# Patient Record
Sex: Female | Born: 1985 | Race: White | Hispanic: No | Marital: Married | Smoking: Never smoker
Health system: Southern US, Community
[De-identification: ages and names within clinical notes are randomized; demographics above are authoritative.]

## PROBLEM LIST (undated history)

## (undated) ENCOUNTER — Inpatient Hospital Stay (HOSPITAL_COMMUNITY): Payer: Self-pay

## (undated) DIAGNOSIS — F32A Depression, unspecified: Secondary | ICD-10-CM

## (undated) DIAGNOSIS — R87629 Unspecified abnormal cytological findings in specimens from vagina: Secondary | ICD-10-CM

## (undated) DIAGNOSIS — F329 Major depressive disorder, single episode, unspecified: Secondary | ICD-10-CM

## (undated) DIAGNOSIS — F419 Anxiety disorder, unspecified: Secondary | ICD-10-CM

## (undated) DIAGNOSIS — IMO0002 Reserved for concepts with insufficient information to code with codable children: Secondary | ICD-10-CM

## (undated) DIAGNOSIS — Z87440 Personal history of urinary (tract) infections: Secondary | ICD-10-CM

## (undated) HISTORY — DX: Anxiety disorder, unspecified: F41.9

## (undated) HISTORY — DX: Personal history of urinary (tract) infections: Z87.440

## (undated) HISTORY — DX: Major depressive disorder, single episode, unspecified: F32.9

## (undated) HISTORY — DX: Unspecified abnormal cytological findings in specimens from vagina: R87.629

## (undated) HISTORY — DX: Depression, unspecified: F32.A

## (undated) HISTORY — PX: LEEP: SHX91

---
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
## 2010-03-17 ENCOUNTER — Ambulatory Visit (HOSPITAL_COMMUNITY): Admission: RE | Admit: 2010-03-17 | Discharge: 2010-03-17 | Payer: Self-pay | Admitting: Obstetrics and Gynecology

## 2010-04-07 ENCOUNTER — Ambulatory Visit (HOSPITAL_COMMUNITY): Admission: RE | Admit: 2010-04-07 | Discharge: 2010-04-07 | Payer: Self-pay | Admitting: Obstetrics and Gynecology

## 2010-07-15 ENCOUNTER — Inpatient Hospital Stay (HOSPITAL_COMMUNITY): Admission: AD | Admit: 2010-07-15 | Discharge: 2010-07-18 | Payer: Self-pay | Admitting: Obstetrics and Gynecology

## 2010-09-26 ENCOUNTER — Encounter: Payer: Self-pay | Admitting: Obstetrics and Gynecology

## 2010-11-16 LAB — CBC
HCT: 24.3 % — ABNORMAL LOW (ref 36.0–46.0)
Hemoglobin: 12.4 g/dL (ref 12.0–15.0)
MCHC: 33.8 g/dL (ref 30.0–36.0)
MCHC: 34.3 g/dL (ref 30.0–36.0)
Platelets: 148 10*3/uL — ABNORMAL LOW (ref 150–400)
RDW: 12.7 % (ref 11.5–15.5)
WBC: 8.5 10*3/uL (ref 4.0–10.5)
WBC: 9.7 10*3/uL (ref 4.0–10.5)

## 2011-07-10 IMAGING — US US OB FOLLOW-UP
1 series · 18 of 28 positions shown · non-contrast
Comparison: none

OBSTETRICAL ULTRASOUND:
 This ultrasound was performed in The [HOSPITAL], and the [facility] report will be stored to [REDACTED] PACS.  This report is also available in [HOSPITAL]?s accessANYware.

[Series 1: us ob follow-up · 18 of 46 slices shown]
[im 1/46]
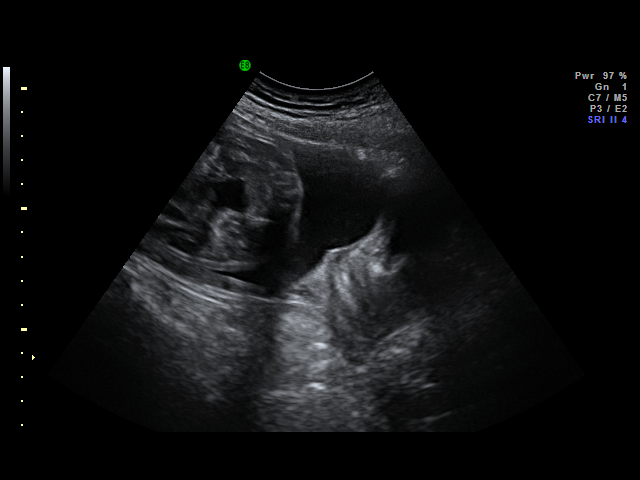
[im 4/46]
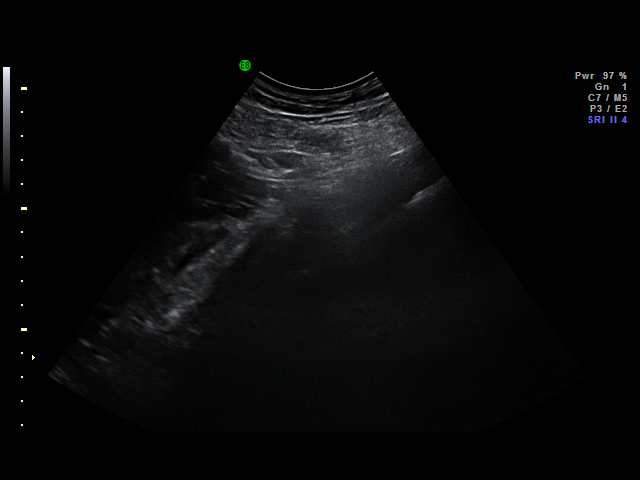
[im 6/46]
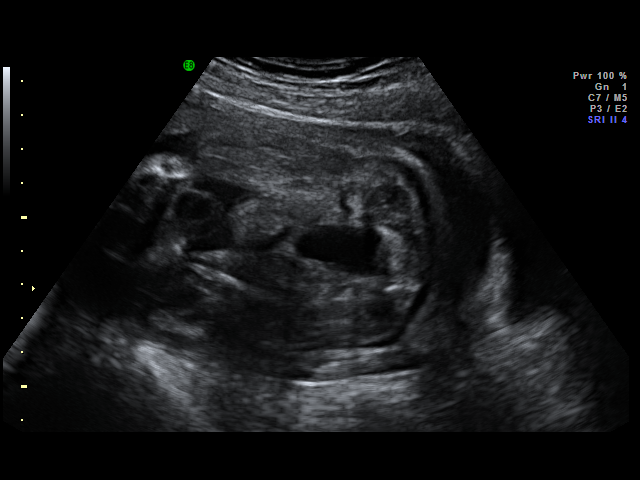
[im 9/46]
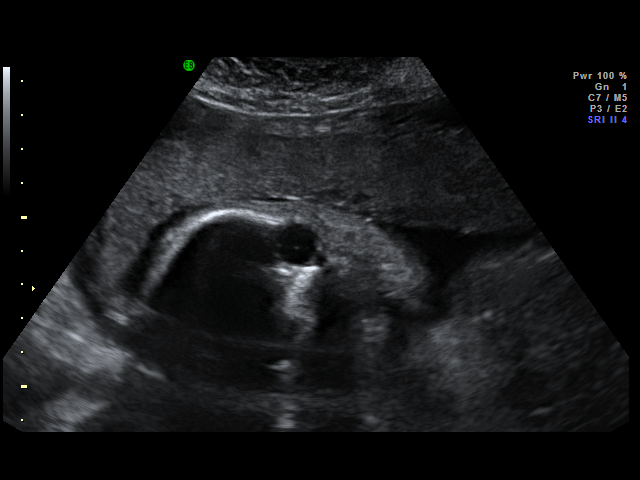
[im 12/46]
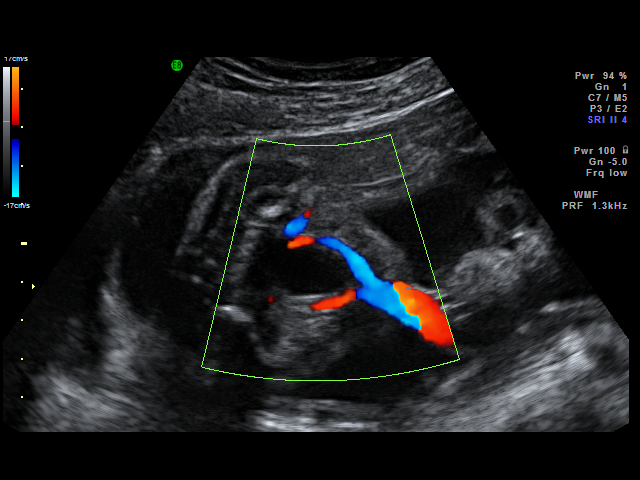
[im 14/46]
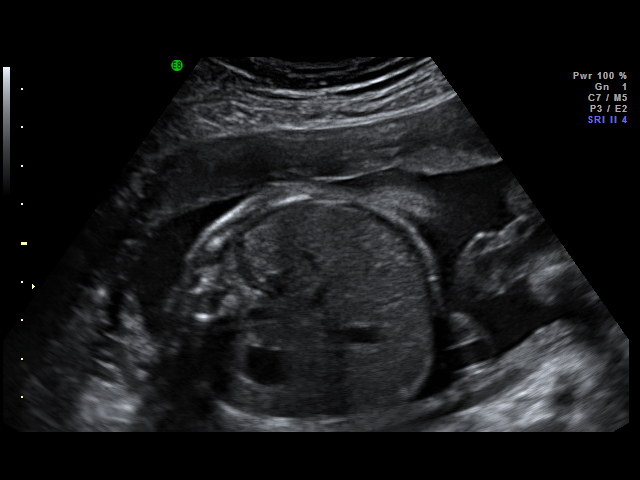
[im 17/46]
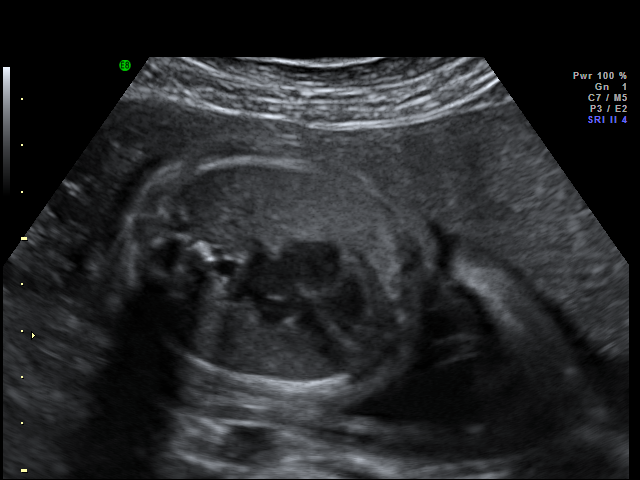
[im 19/46]
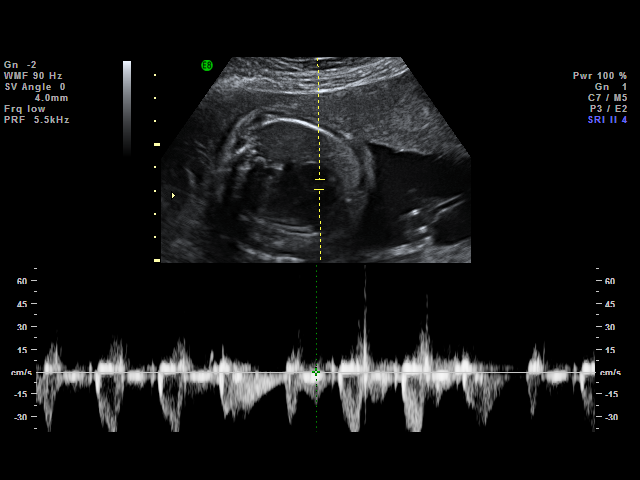
[im 22/46]
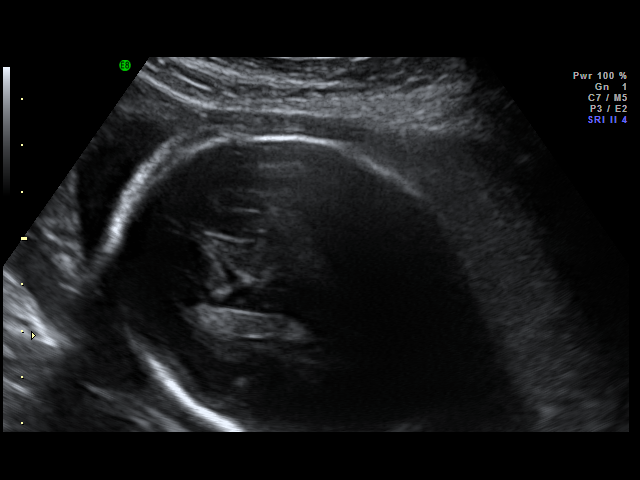
[im 24/46]
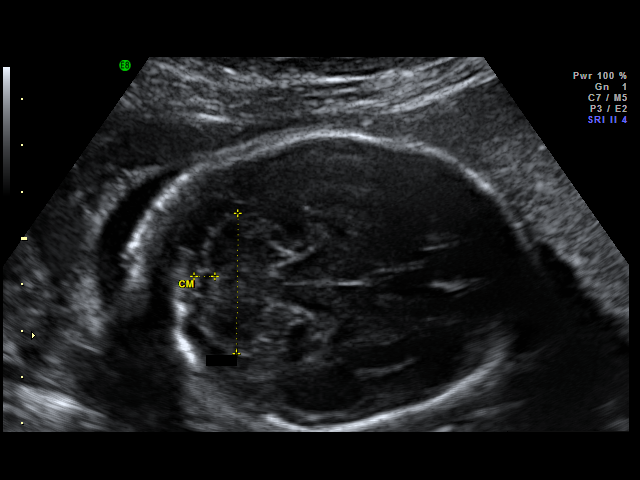
[im 27/46]
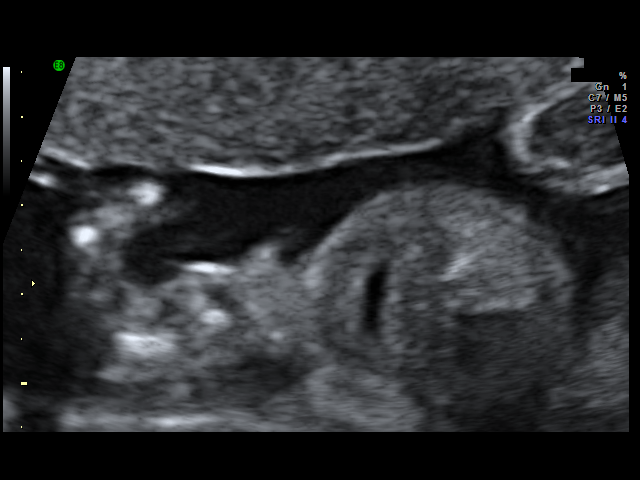
[im 29/46]
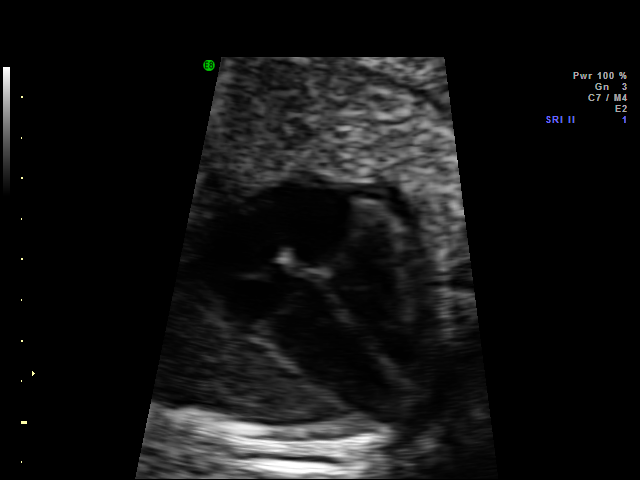
[im 32/46]
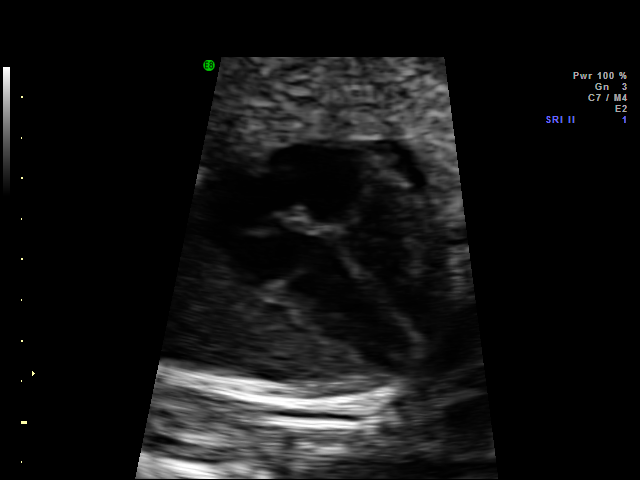
[im 36/46]
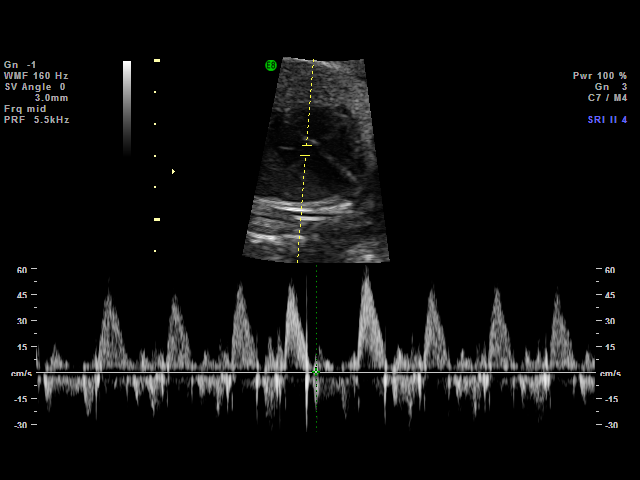
[im 37/46]
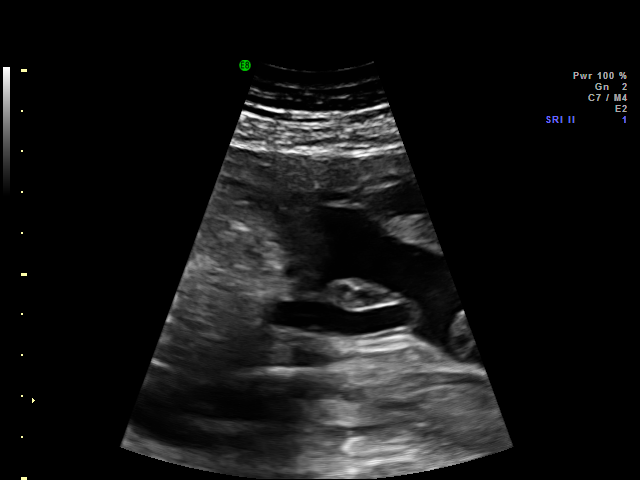
[im 41/46]
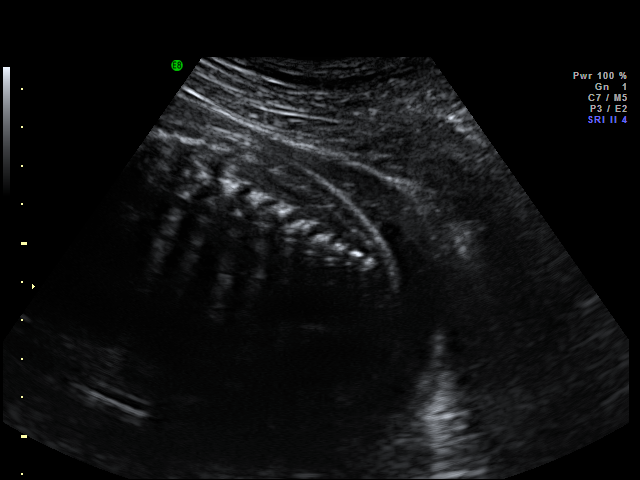
[im 42/46]
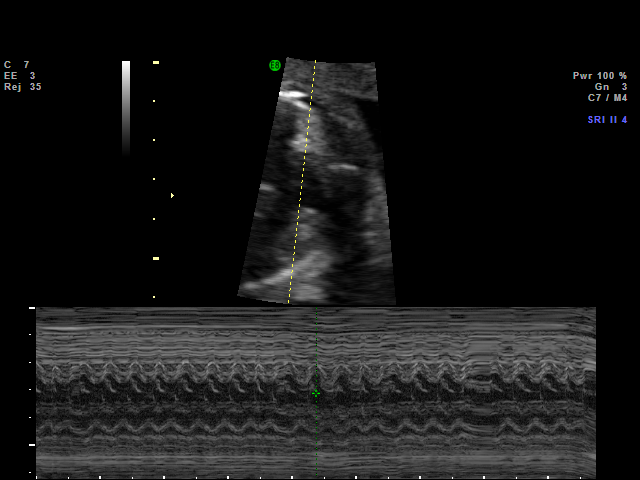
[im 46/46]
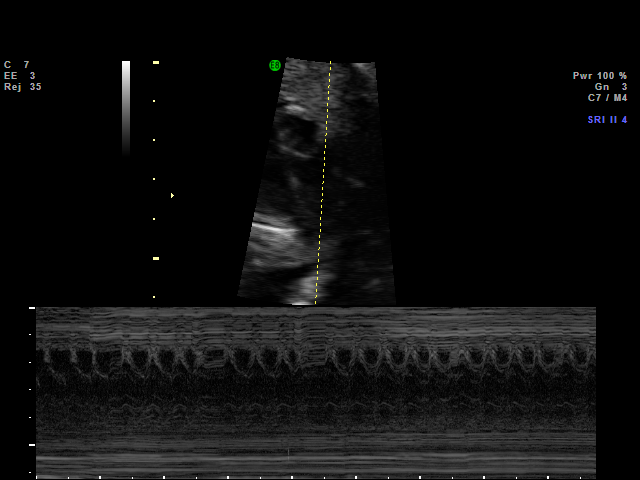

[18 of 28 positions shown; findings below may reference images not displayed]

IMPRESSION: [facility] has also been faxed to the ordering physician.

## 2012-01-19 NOTE — H&P (Addendum)
26 yo G2P1 w/ missed Ab presents for surgical mngt  PMHx: neg PSHx:  none Meds: PNV All:  None OBhx:  SVD x 1 FHx:  N/c  AF, VSS gen - NAD Abd - soft, NT Ext - no edema CV - RRR Lungs - clear  [place] - IUP at 8wks, no CM  A/P:  Missed Ab.  Exp mngt vs medical mngt vs surgical mngt.  Pt elects for D&E Plan of care discussed again with pt.  Informed consent

## 2012-01-22 MED ORDER — DEXTROSE 5 % IV SOLN
1.0000 g | INTRAVENOUS | Status: AC
  Administered 2012-01-23: 1 g via INTRAVENOUS
  Filled 2012-01-22: qty 1

## 2012-01-23 ENCOUNTER — Encounter (HOSPITAL_COMMUNITY)
Admission: RE | Disposition: A | Discharge status: Home / Self Care | Payer: Self-pay | Source: Ambulatory Visit | Attending: Obstetrics and Gynecology

## 2012-01-23 ENCOUNTER — Encounter (HOSPITAL_COMMUNITY): Payer: Self-pay | Admitting: Anesthesiology

## 2012-01-23 ENCOUNTER — Encounter (HOSPITAL_COMMUNITY): Payer: Self-pay | Admitting: *Deleted

## 2012-01-23 ENCOUNTER — Ambulatory Visit (HOSPITAL_COMMUNITY)
Admission: RE | Admit: 2012-01-23 | Discharge: 2012-01-23 | Disposition: A | Discharge status: Home / Self Care | Payer: BC Managed Care – PPO | Source: Ambulatory Visit | Attending: Obstetrics and Gynecology | Admitting: Obstetrics and Gynecology

## 2012-01-23 ENCOUNTER — Ambulatory Visit (HOSPITAL_COMMUNITY): Payer: BC Managed Care – PPO | Admitting: Anesthesiology

## 2012-01-23 DIAGNOSIS — O021 Missed abortion: Secondary | ICD-10-CM | POA: Insufficient documentation

## 2012-01-23 HISTORY — PX: DILATION AND EVACUATION: SHX1459

## 2012-01-23 LAB — CBC
HCT: 34.7 % — ABNORMAL LOW (ref 36.0–46.0)
MCH: 30.4 pg (ref 26.0–34.0)
MCV: 88.7 fL (ref 78.0–100.0)
Platelets: 181 10*3/uL (ref 150–400)
RBC: 3.91 MIL/uL (ref 3.87–5.11)
RDW: 12.1 % (ref 11.5–15.5)
WBC: 4.3 10*3/uL (ref 4.0–10.5)

## 2012-01-23 SURGERY — DILATION AND EVACUATION, UTERUS
Anesthesia: Monitor Anesthesia Care

## 2012-01-23 MED ORDER — MIDAZOLAM HCL 5 MG/5ML IJ SOLN
INTRAMUSCULAR | Status: DC | PRN
  Administered 2012-01-23: 2 mg via INTRAVENOUS

## 2012-01-23 MED ORDER — DEXAMETHASONE SODIUM PHOSPHATE 10 MG/ML IJ SOLN
INTRAMUSCULAR | Status: AC
  Filled 2012-01-23: qty 1

## 2012-01-23 MED ORDER — MIDAZOLAM HCL 2 MG/2ML IJ SOLN
INTRAMUSCULAR | Status: AC
  Filled 2012-01-23: qty 2

## 2012-01-23 MED ORDER — HYDROCODONE-ACETAMINOPHEN 5-500 MG PO TABS: 1.0000 | ORAL_TABLET | Freq: Four times a day (QID) | ORAL | Status: AC | PRN

## 2012-01-23 MED ORDER — DEXAMETHASONE SODIUM PHOSPHATE 4 MG/ML IJ SOLN
INTRAMUSCULAR | Status: DC | PRN
  Administered 2012-01-23: 10 mg via INTRAVENOUS

## 2012-01-23 MED ORDER — KETOROLAC TROMETHAMINE 30 MG/ML IJ SOLN
INTRAMUSCULAR | Status: DC | PRN
  Administered 2012-01-23: 30 mg via INTRAVENOUS

## 2012-01-23 MED ORDER — LIDOCAINE HCL (CARDIAC) 20 MG/ML IV SOLN
INTRAVENOUS | Status: AC
  Filled 2012-01-23: qty 5

## 2012-01-23 MED ORDER — PROPOFOL 10 MG/ML IV EMUL
INTRAVENOUS | Status: DC | PRN
  Administered 2012-01-23: 100 ug/kg/min via INTRAVENOUS

## 2012-01-23 MED ORDER — PROPOFOL 10 MG/ML IV EMUL
INTRAVENOUS | Status: AC
  Filled 2012-01-23: qty 20

## 2012-01-23 MED ORDER — FENTANYL CITRATE 0.05 MG/ML IJ SOLN
INTRAMUSCULAR | Status: DC | PRN
  Administered 2012-01-23 (×2): 50 ug via INTRAVENOUS

## 2012-01-23 MED ORDER — ONDANSETRON HCL 4 MG/2ML IJ SOLN
INTRAMUSCULAR | Status: DC | PRN
  Administered 2012-01-23: 4 mg via INTRAVENOUS

## 2012-01-23 MED ORDER — FENTANYL CITRATE 0.05 MG/ML IJ SOLN: 25.0000 ug | INTRAMUSCULAR | Status: DC | PRN

## 2012-01-23 MED ORDER — LACTATED RINGERS IV SOLN
INTRAVENOUS | Status: DC
  Administered 2012-01-23: 125 mL/h via INTRAVENOUS

## 2012-01-23 MED ORDER — CHLOROPROCAINE HCL 1 % IJ SOLN
INTRAMUSCULAR | Status: AC
  Filled 2012-01-23: qty 30

## 2012-01-23 MED ORDER — IBUPROFEN 200 MG PO TABS: 800.0000 mg | ORAL_TABLET | Freq: Three times a day (TID) | ORAL | Status: AC | PRN

## 2012-01-23 MED ORDER — ONDANSETRON HCL 4 MG/2ML IJ SOLN
INTRAMUSCULAR | Status: AC
  Filled 2012-01-23: qty 2

## 2012-01-23 MED ORDER — METHYLERGONOVINE MALEATE 0.2 MG PO TABS: 0.2000 mg | ORAL_TABLET | Freq: Four times a day (QID) | ORAL | Status: DC

## 2012-01-23 MED ORDER — FENTANYL CITRATE 0.05 MG/ML IJ SOLN
INTRAMUSCULAR | Status: AC
  Filled 2012-01-23: qty 2

## 2012-01-23 MED ORDER — KETOROLAC TROMETHAMINE 30 MG/ML IJ SOLN
INTRAMUSCULAR | Status: AC
  Filled 2012-01-23: qty 1

## 2012-01-23 SURGICAL SUPPLY — 20 items
CATH ROBINSON RED A/P 16FR (CATHETERS) ×2 IMPLANT
CLOTH BEACON ORANGE TIMEOUT ST (SAFETY) ×2 IMPLANT
DECANTER SPIKE VIAL GLASS SM (MISCELLANEOUS) ×2 IMPLANT
GLOVE BIO SURGEON STRL SZ 6.5 (GLOVE) ×2 IMPLANT
GLOVE BIOGEL PI IND STRL 7.0 (GLOVE) ×1 IMPLANT
GLOVE BIOGEL PI INDICATOR 7.0 (GLOVE) ×1
GOWN PREVENTION PLUS LG XLONG (DISPOSABLE) ×2 IMPLANT
GOWN STRL REIN XL XLG (GOWN DISPOSABLE) ×2 IMPLANT
KIT BERKELEY 1ST TRIMESTER 3/8 (MISCELLANEOUS) ×2 IMPLANT
NEEDLE SPNL 22GX3.5 QUINCKE BK (NEEDLE) ×2 IMPLANT
NS IRRIG 1000ML POUR BTL (IV SOLUTION) ×2 IMPLANT
PACK VAGINAL MINOR WOMEN LF (CUSTOM PROCEDURE TRAY) ×2 IMPLANT
PAD PREP 24X48 CUFFED NSTRL (MISCELLANEOUS) ×2 IMPLANT
SET BERKELEY SUCTION TUBING (SUCTIONS) ×2 IMPLANT
SYR CONTROL 10ML LL (SYRINGE) ×2 IMPLANT
TOWEL OR 17X24 6PK STRL BLUE (TOWEL DISPOSABLE) ×4 IMPLANT
VACURETTE 10 RIGID CVD (CANNULA) IMPLANT
VACURETTE 7MM CVD STRL WRAP (CANNULA) IMPLANT
VACURETTE 8 RIGID CVD (CANNULA) IMPLANT
VACURETTE 9 RIGID CVD (CANNULA) IMPLANT

## 2012-01-23 NOTE — Op Note (Signed)
NAMEANALYSE, ANGST NO.:  [id number]  MEDICAL RECORD NO.:  [id number]  LOCATION:  [facility]                          FACILITY:  [facility]  PHYSICIAN:  Zelphia Cairo, MD    DATE OF BIRTH:  Jun 15, 1986  DATE OF PROCEDURE: DATE OF DISCHARGE:  01/23/2012                              OPERATIVE REPORT   PREOPERATIVE DIAGNOSIS:  Missed abortion.  POSTOPERATIVE DIAGNOSIS:  Missed abortion.  PROCEDURES: 1. Cervical block. 2. Dilation and evacuation.  SURGEON:  Zelphia Cairo, MD  ANESTHESIA:  MAC with local.  COMPLICATIONS:  None.  CONDITION:  Stable to recovery room.  PROCEDURE:  The patient was taken to the operating room.  After informed consent was obtained, she was given anesthesia, placed in the dorsal lithotomy position using Allen stirrups.  She was prepped and draped in a sterile fashion and an in-and-out catheter was used to drain her bladder.  Bivalve speculum was placed in the vagina and 1 mL of 1% Nesacaine was used at the anterior lip of the cervix.  Single-tooth tenaculum was placed on the anterior lip of the cervix and a cervical block was performed.  The cervix was then serially dilated using Pratt dilators.  An 8-French suction catheter was inserted into the endometrial cavity and products of conception were removed without difficulty.  A gentle curetting was then performed to ensure uterine cry.  No further products of conception were removed.  Suction curette was inserted 1 last time to remove any clots and debris.  Tenaculum was removed.  The cervix was hemostatic.  Speculum was removed.  The patient was taken to the recovery room in stable condition.     Zelphia Cairo, MD     GA/MEDQ  D:  01/23/2012  T:  01/23/2012  Job:  [id number]

## 2012-01-23 NOTE — Anesthesia Preprocedure Evaluation (Signed)
Anesthesia Evaluation  Patient identified by MRN, date of birth, ID band Patient awake    Reviewed: Allergy & Precautions, H&P , Patient's Chart, lab work & pertinent test results, reviewed documented beta blocker date and time   Airway Mallampati: II TM Distance: >3 FB Neck ROM: full    Dental No notable dental hx.    Pulmonary  breath sounds clear to auscultation  Pulmonary exam normal       Cardiovascular Rhythm:regular Rate:Normal     Neuro/Psych    GI/Hepatic   Endo/Other    Renal/GU      Musculoskeletal   Abdominal   Peds  Hematology   Anesthesia Other Findings   Reproductive/Obstetrics                           Anesthesia Physical Anesthesia Plan  ASA: II  Anesthesia Plan: MAC   Post-op Pain Management:    Induction: Intravenous  Airway Management Planned: Mask, Natural Airway and Simple Face Mask  Additional Equipment:   Intra-op Plan:   Post-operative Plan:   Informed Consent: I have reviewed the patients History and Physical, chart, labs and discussed the procedure including the risks, benefits and alternatives for the proposed anesthesia with the patient or authorized representative who has indicated his/her understanding and acceptance.   Dental Advisory Given  Plan Discussed with: CRNA and Surgeon  Anesthesia Plan Comments: (  Discussed  MAC and general anesthesia, including possible nausea, instrumentation of airway, sore throat,pulmonary aspiration, etc. I asked if the were any outstanding questions, or  concerns before we proceeded. )        Anesthesia Quick Evaluation

## 2012-01-23 NOTE — Anesthesia Postprocedure Evaluation (Signed)
Anesthesia Post Note  Patient: Courtney Tyler  Procedure(s) Performed: Procedure(s) (LRB): DILATATION AND EVACUATION (N/A)  Anesthesia type: MAC  Patient location: PACU  Post pain: Pain level controlled  Post assessment: Post-op Vital signs reviewed  Last Vitals:  Filed Vitals:   01/23/12 0800  BP: 91/49  Pulse: 51  Temp:   Resp: 16    Post vital signs: Reviewed  Level of consciousness: sedated  Complications: No apparent anesthesia complications

## 2012-01-23 NOTE — Transfer of Care (Signed)
Immediate Anesthesia Transfer of Care Note  Patient: Courtney Tyler  Procedure(s) Performed: Procedure(s) (LRB): DILATATION AND EVACUATION (N/A)  Patient Location: PACU  Anesthesia Type: MAC  Level of Consciousness: awake, alert  and oriented  Airway & Oxygen Therapy: Patient Spontanous Breathing  Post-op Assessment: Report given to PACU RN and Post -op Vital signs reviewed and stable  Post vital signs: Reviewed and stable  Complications: No apparent anesthesia complications

## 2012-01-23 NOTE — Discharge Instructions (Signed)
[facility] office 2-3 weeks for postop appointment.  Call the office [phone] for an appointment.  Personal Hygiene: Use pads not tampons x 1week You may shower, no tub baths or pools for 2-3 weeks Wipe from front to back when using restroom  Activity: Do not drive or operate any equipment for 24 hrs.   Do not rest in bed all day Walking is encouraged Walk up and down stairs slowly You may return to your normal activity in 1-2 days  Sexual Activity:  No intercourse for 2 weeks after the procedure.  Diet: Eat a light meal as desired this evening.  You may resume your usual diet tomorrow.  Return to work:  You may resume your work activities after 1-2 days  What to expect:  Expect to have vaginal bleeding/discharge for 2-3 days and spotting for 10-14 days.  It is not unusual to have soreness for 1-2 weeks.  You may have a slight burning sensation when you urinate for the first few days.  You may start your menses in 2-6 weeks.  Mild cramps may continue for a couple of days.    Call your doctor:   Excessive bleeding, saturating a pad every hour Inability to urinate 6 hours after discharge Pain not relieved with pain medications Fever of 100.4 or greater DISCHARGE INSTRUCTIONS: D&C / D&E The following instructions have been prepared to help you care for yourself upon your return home.   Personal hygiene: Marland Kitchen Use sanitary pads for vaginal drainage, not tampons. . Shower the day after your procedure. . NO tub baths, pools or Jacuzzis for 2-3 weeks. . Wipe front to back after using the bathroom.  Activity and limitations: . Do NOT drive or operate any equipment for 24 hours. The effects of anesthesia are still present and drowsiness may result. . Do NOT rest in bed all day. . Walking is encouraged. . Walk up and down stairs slowly. . You may resume your normal activity in one to two days or as indicated by your physician.  Sexual activity: NO intercourse for at least 2 weeks after the  procedure, or as indicated by your physician.  Diet: Eat a light meal as desired this evening. You may resume your usual diet tomorrow.  Return to work: You may resume your work activities in one to two days or as indicated by your doctor.  What to expect after your surgery: Expect to have vaginal bleeding/discharge for 2-3 days and spotting for up to 10 days. It is not unusual to have soreness for up to 1-2 weeks. You may have a slight burning sensation when you urinate for the first day. Mild cramps may continue for a couple of days. You may have a regular period in 2-6 weeks.  Call your doctor for any of the following: . Excessive vaginal bleeding, saturating and changing one pad every hour. . Inability to urinate 6 hours after discharge from hospital. . Pain not relieved by pain medication. . Fever of 100.4 F or greater. . Unusual vaginal discharge or odor.  Return to office ________________ Call for an appointment ___________________  Patient's signature: ______________________  Nurse's signature ________________________  Post Anesthesia Care Unit [phone]

## 2012-03-11 ENCOUNTER — Emergency Department (HOSPITAL_COMMUNITY): Payer: BC Managed Care – PPO

## 2012-03-11 ENCOUNTER — Encounter (HOSPITAL_COMMUNITY): Payer: Self-pay

## 2012-03-11 ENCOUNTER — Emergency Department (HOSPITAL_COMMUNITY)
Admission: EM | Admit: 2012-03-11 | Discharge: 2012-03-11 | Disposition: A | Discharge status: Home / Self Care | Payer: BC Managed Care – PPO | Attending: Emergency Medicine | Admitting: Emergency Medicine

## 2012-03-11 DIAGNOSIS — N939 Abnormal uterine and vaginal bleeding, unspecified: Secondary | ICD-10-CM

## 2012-03-11 DIAGNOSIS — N898 Other specified noninflammatory disorders of vagina: Secondary | ICD-10-CM | POA: Insufficient documentation

## 2012-03-11 DIAGNOSIS — N9489 Other specified conditions associated with female genital organs and menstrual cycle: Secondary | ICD-10-CM | POA: Insufficient documentation

## 2012-03-11 DIAGNOSIS — N858 Other specified noninflammatory disorders of uterus: Secondary | ICD-10-CM

## 2012-03-11 LAB — URINALYSIS, ROUTINE W REFLEX MICROSCOPIC
Glucose, UA: NEGATIVE mg/dL
Leukocytes, UA: NEGATIVE
Specific Gravity, Urine: 1.006 (ref 1.005–1.030)
pH: 7 (ref 5.0–8.0)

## 2012-03-11 LAB — HCG, QUANTITATIVE, PREGNANCY: hCG, Beta Chain, Quant, S: 323 m[IU]/mL — ABNORMAL HIGH (ref <5)

## 2012-03-11 LAB — BASIC METABOLIC PANEL
CO2: 26 mEq/L (ref 19–32)
Chloride: 104 mEq/L (ref 96–112)
Creatinine, Ser: 0.79 mg/dL (ref 0.50–1.10)
GFR calc Af Amer: >90 mL/min (ref >90)
Potassium: 3.8 mEq/L (ref 3.5–5.1)

## 2012-03-11 LAB — CBC WITH DIFFERENTIAL/PLATELET
Basophils Absolute: 0 10*3/uL (ref 0.0–0.1)
Basophils Relative: 0 % (ref 0–1)
HCT: 37.2 % (ref 36.0–46.0)
Hemoglobin: 12.6 g/dL (ref 12.0–15.0)
Lymphocytes Relative: 28 % (ref 12–46)
MCHC: 33.9 g/dL (ref 30.0–36.0)
Monocytes Absolute: 0.2 10*3/uL (ref 0.1–1.0)
Monocytes Relative: 5 % (ref 3–12)
Neutro Abs: 3 10*3/uL (ref 1.7–7.7)
Neutrophils Relative %: 66 % (ref 43–77)
RDW: 12.1 % (ref 11.5–15.5)
WBC: 4.6 10*3/uL (ref 4.0–10.5)

## 2012-03-11 LAB — PREGNANCY, URINE: Preg Test, Ur: POSITIVE — AB

## 2012-03-11 NOTE — ED Provider Notes (Signed)
History     CSN: [id number]  Arrival date & time 03/11/12  0931   First MD Initiated Contact with Patient 03/11/12 [phone]      Chief Complaint  Patient presents with  . Vaginal Bleeding    (Consider location/radiation/quality/duration/timing/severity/associated sxs/prior treatment) HPI Pt had D&C in May for retained products after spontaneous abortion. Has had periodic bleeding since and was much worse today passing blood clots. Pt had lightheadedness earlier though currently asymptomatic.  Past Medical History  Diagnosis Date  . No pertinent past medical history     Past Surgical History  Procedure Date  . Vaginal delivery 07/16/10  . Dilation and evacuation 01/23/2012    Procedure: DILATATION AND EVACUATION;  Surgeon: Zelphia Cairo, MD;  Location: [facility];  Service: Gynecology;  Laterality: N/A;    No family history on file.  History  Substance Use Topics  . Smoking status: Not on file  . Smokeless tobacco: Not on file  . Alcohol Use: No    OB History    Grav Para Term Preterm Abortions TAB SAB Ect Mult Living                  Review of Systems  Constitutional: Negative for fever and chills.  Gastrointestinal: Negative for nausea, vomiting and abdominal pain.  Genitourinary: Positive for vaginal bleeding. Negative for vaginal discharge, vaginal pain and pelvic pain.  Neurological: Positive for light-headedness. Negative for weakness and headaches.    Allergies  Review of patient's allergies indicates no known allergies.  Home Medications   Current Outpatient Rx  Name Route Sig Dispense Refill  . GREEN COFFEE BEAN PO Oral Take 1 tablet by mouth daily.    Marland Kitchen PRENATAL MULTIVITAMIN CH Oral Take 1 tablet by mouth daily.      BP 129/82  Pulse 55  Temp 97.9 F (36.6 C) (Oral)  Resp 16  Ht 5' 7.5" (1.715 m)  Wt 150 lb (68.04 kg)  BMI 23.15 kg/m2  SpO2 100%  LMP 03/11/2012  Physical Exam  Nursing note and vitals reviewed. Constitutional: She is oriented  to person, place, and time. She appears well-developed and well-nourished. No distress.  HENT:  Head: Normocephalic and atraumatic.  Mouth/Throat: Oropharynx is clear and moist.  Eyes: EOM are normal. Pupils are equal, round, and reactive to light.  Neck: Normal range of motion. Neck supple.  Cardiovascular: Normal rate and regular rhythm.   Pulmonary/Chest: Effort normal and breath sounds normal. No respiratory distress. She has no wheezes. She has no rales.  Abdominal: Soft. Bowel sounds are normal. There is no tenderness. There is no rebound and no guarding.  Genitourinary:       Small amount of blood in vaginal vault. Os closed. No clots or tissue present. No vag d/c  Musculoskeletal: Normal range of motion. She exhibits no edema and no tenderness.  Neurological: She is alert and oriented to person, place, and time.       Moves all ext without deficit  Skin: Skin is warm and dry. No rash noted. No erythema.  Psychiatric: She has a normal mood and affect. Her behavior is normal.    ED Course  Procedures (including critical care time)  Labs Reviewed  URINALYSIS, ROUTINE W REFLEX MICROSCOPIC - Abnormal; Notable for the following:    Hgb urine dipstick LARGE (*)     All other components within normal limits  PREGNANCY, URINE - Abnormal; Notable for the following:    Preg Test, Ur POSITIVE (*)  All other components within normal limits  HCG, QUANTITATIVE, PREGNANCY - Abnormal; Notable for the following:    hCG, Beta Chain, Quant, S 323 (*)     All other components within normal limits  CBC WITH DIFFERENTIAL  BASIC METABOLIC PANEL  URINE MICROSCOPIC-ADD ON   US Ob Comp Less 14 Wks  03/11/2012  *RADIOLOGY REPORT*  Clinical Data: Vaginal bleeding.  18 weeks and 1 day pregnant by last menstrual period.  Quantitative beta HCG pending.  OBSTETRIC <14 WK [place] AND TRANSVAGINAL OB US  Technique:  Both transabdominal and transvaginal ultrasound examinations were performed for complete  evaluation of the gestation as well as the maternal uterus, adnexal regions, and pelvic cul-de-sac.  Transvaginal technique was performed to assess early pregnancy.  Comparison:  Previous examinations, the most recent dated 04/07/2010.  Intrauterine gestational sac:  Not visualized. Yolk sac: Not visualized. Embryo: Not visualized. Cardiac Activity: Not visualized.  Maternal uterus/adnexae: There is a rounded, heterogeneous mass within the fundal portion of the uterus, centrally.  This has mildly prominent internal blood flow with color Doppler and measures 4.9 x 3.5 x 2.8 cm in maximum dimensions.  The fundal endometrium is not visualized separate from this mass.  A 2.0 cm simple cyst is noted in the right ovary.  The left ovary is unremarkable.  No free peritoneal fluid is seen.  IMPRESSION:  1.  Heterogeneous mass in the fundal portion of the uterus in the expected position of the endometrium.  This has prominent internal blood flow with color Doppler and is concerning for the possibility of a molar pregnancy.  A uterine fibroid is less likely. 2.  2.0 cm simple appearing right ovarian cyst.  Original Report Authenticated By: Darrol Angel, M.D.   US Ob Transvaginal  03/11/2012  *RADIOLOGY REPORT*  Clinical Data: Vaginal bleeding.  18 weeks and 1 day pregnant by last menstrual period.  Quantitative beta HCG pending.  OBSTETRIC <14 WK [place] AND TRANSVAGINAL OB US  Technique:  Both transabdominal and transvaginal ultrasound examinations were performed for complete evaluation of the gestation as well as the maternal uterus, adnexal regions, and pelvic cul-de-sac.  Transvaginal technique was performed to assess early pregnancy.  Comparison:  Previous examinations, the most recent dated 04/07/2010.  Intrauterine gestational sac:  Not visualized. Yolk sac: Not visualized. Embryo: Not visualized. Cardiac Activity: Not visualized.  Maternal uterus/adnexae: There is a rounded, heterogeneous mass within the fundal portion  of the uterus, centrally.  This has mildly prominent internal blood flow with color Doppler and measures 4.9 x 3.5 x 2.8 cm in maximum dimensions.  The fundal endometrium is not visualized separate from this mass.  A 2.0 cm simple cyst is noted in the right ovary.  The left ovary is unremarkable.  No free peritoneal fluid is seen.  IMPRESSION:  1.  Heterogeneous mass in the fundal portion of the uterus in the expected position of the endometrium.  This has prominent internal blood flow with color Doppler and is concerning for the possibility of a molar pregnancy.  A uterine fibroid is less likely. 2.  2.0 cm simple appearing right ovarian cyst.  Original Report Authenticated By: Darrol Angel, M.D.     1. Vaginal bleeding   2. Uterine mass       MDM   Discussed results of [place] and labs with Dr Milton Ferguson. She believes that elevated quant and findings of mass in fundus likely represents continued retained products. Pt advised to F/u with Dr Renaldo Fiddler tomorrow to  be seen in clinic. Pt advised to return for worsening bleeding or any concerns       Loren Racer, MD 03/11/12 1257

## 2012-03-11 NOTE — ED Notes (Signed)
PELVIC SET UP

## 2012-03-11 NOTE — ED Notes (Signed)
Pt in from home states [facility] on may 6 related to miscarriage states recently been having AVB, states this am constant bleeding non stop while using the bathroom,  States before arrival soaked through 2 pads, passing large clot denies pain/cramping states some dizziness at times

## 2012-03-17 ENCOUNTER — Encounter (HOSPITAL_COMMUNITY)
Admission: AD | Disposition: A | Discharge status: Home / Self Care | Payer: Self-pay | Source: Ambulatory Visit | Attending: Obstetrics and Gynecology

## 2012-03-17 ENCOUNTER — Encounter (HOSPITAL_COMMUNITY): Payer: Self-pay

## 2012-03-17 ENCOUNTER — Ambulatory Visit (HOSPITAL_COMMUNITY)
Admission: AD | Admit: 2012-03-17 | Discharge: 2012-03-18 | Disposition: A | Discharge status: Home / Self Care | Payer: BC Managed Care – PPO | Source: Ambulatory Visit | Attending: Obstetrics and Gynecology | Admitting: Obstetrics and Gynecology

## 2012-03-17 ENCOUNTER — Inpatient Hospital Stay (HOSPITAL_COMMUNITY): Payer: BC Managed Care – PPO

## 2012-03-17 DIAGNOSIS — O034 Incomplete spontaneous abortion without complication: Principal | ICD-10-CM | POA: Insufficient documentation

## 2012-03-17 HISTORY — DX: Reserved for concepts with insufficient information to code with codable children: IMO0002

## 2012-03-17 HISTORY — PX: DILATION AND EVACUATION: SHX1459

## 2012-03-17 LAB — CBC
HCT: 33.6 % — ABNORMAL LOW (ref 36.0–46.0)
Hemoglobin: 11 g/dL — ABNORMAL LOW (ref 12.0–15.0)
MCH: 29.8 pg (ref 26.0–34.0)
MCHC: 32.7 g/dL (ref 30.0–36.0)

## 2012-03-17 LAB — TYPE AND SCREEN: Antibody Screen: NEGATIVE

## 2012-03-17 SURGERY — DILATION AND EVACUATION, UTERUS
Anesthesia: General | Site: Vagina | Wound class: Contaminated

## 2012-03-17 MED ORDER — FAMOTIDINE IN NACL 20-0.9 MG/50ML-% IV SOLN
20.0000 mg | Freq: Once | INTRAVENOUS | Status: AC
  Administered 2012-03-18: 20 mg via INTRAVENOUS
  Filled 2012-03-17: qty 50

## 2012-03-17 MED ORDER — DEXTROSE 5 % IV SOLN
2.0000 g | INTRAVENOUS | Status: AC
  Administered 2012-03-18: 2 g via INTRAVENOUS
  Filled 2012-03-17: qty 2

## 2012-03-17 MED ORDER — CITRIC ACID-SODIUM CITRATE 334-500 MG/5ML PO SOLN
30.0000 mL | Freq: Once | ORAL | Status: AC
  Administered 2012-03-18: 30 mL via ORAL
  Filled 2012-03-17: qty 15

## 2012-03-17 SURGICAL SUPPLY — 20 items
CATH ROBINSON RED A/P 16FR (CATHETERS) ×2 IMPLANT
CLOTH BEACON ORANGE TIMEOUT ST (SAFETY) ×2 IMPLANT
DECANTER SPIKE VIAL GLASS SM (MISCELLANEOUS) ×2 IMPLANT
GLOVE BIO SURGEON STRL SZ 6.5 (GLOVE) ×2 IMPLANT
GLOVE BIOGEL PI IND STRL 7.0 (GLOVE) ×1 IMPLANT
GLOVE BIOGEL PI INDICATOR 7.0 (GLOVE) ×1
GOWN PREVENTION PLUS LG XLONG (DISPOSABLE) ×2 IMPLANT
GOWN STRL REIN XL XLG (GOWN DISPOSABLE) ×2 IMPLANT
KIT BERKELEY 1ST TRIMESTER 3/8 (MISCELLANEOUS) ×2 IMPLANT
NEEDLE SPNL 22GX3.5 QUINCKE BK (NEEDLE) ×2 IMPLANT
NS IRRIG 1000ML POUR BTL (IV SOLUTION) ×2 IMPLANT
PACK VAGINAL MINOR WOMEN LF (CUSTOM PROCEDURE TRAY) ×2 IMPLANT
PAD PREP 24X48 CUFFED NSTRL (MISCELLANEOUS) ×2 IMPLANT
SET BERKELEY SUCTION TUBING (SUCTIONS) ×2 IMPLANT
SYR CONTROL 10ML LL (SYRINGE) ×2 IMPLANT
TOWEL OR 17X24 6PK STRL BLUE (TOWEL DISPOSABLE) ×4 IMPLANT
VACURETTE 10 RIGID CVD (CANNULA) IMPLANT
VACURETTE 7MM CVD STRL WRAP (CANNULA) IMPLANT
VACURETTE 8 RIGID CVD (CANNULA) IMPLANT
VACURETTE 9 RIGID CVD (CANNULA) IMPLANT

## 2012-03-17 NOTE — H&P (Signed)
26 yo G3P1 presents w/ another episode of heavy VB.  Pt has been followed in the office for SAB vs retained POC.  Yesterday, she was noted to have plateau in quant and rec to have D&C.  Pt declined b/c of plans to go out of town, instructed to call w/ any change in status.  Tonight, she had another episode of heavy VB.  No pain or cramping.  She did pass clot and tissue.  Bleeding has stopped.  PMHx:  None PSHx:  SVD, D&E 5/13 SHx:  Negative FHx:  N/a  AF, VSS Gen - NAD, pt tearful Abd - soft, NT, ND Ext - NT, no edema PV - deferred b/c [place] ordered  US - pending Hgb - 11.0  A/P:  Possible incomplete Ab vs SAB Await [place] results.

## 2012-03-17 NOTE — MAU Note (Signed)
Increased vaginal bleeding with clots today started at 2030. Denies abdominal cramping or pain. Was seen at [facility] last week with retained products & scheduled for D&E on 7/18 with Dr. Renaldo Fiddler.

## 2012-03-17 NOTE — MAU Provider Note (Signed)
  History     CSN: [id number]  Arrival date and time: 03/17/12 2113   First Provider Initiated Contact with Patient 03/17/12 2159      Chief Complaint  Patient presents with  . Vaginal Bleeding   HPI Courtney Tyler 26 y.o. Having heavy vaginal bleeding with clots tonight.  Very worried and tearful.  Currently bleeding has lessened.  Denies having any abdominal pain.  Was seen at [facility] last weekend and has been followed in the office this week.  OB History    Grav Para Term Preterm Abortions TAB SAB Ect Mult Living   3 1 1  2  2   1       Past Medical History  Diagnosis Date  . Abnormal Pap smear 2006    colpo & LEEP    Past Surgical History  Procedure Date  . Vaginal delivery 07/16/10  . Dilation and evacuation 01/23/2012    Procedure: DILATATION AND EVACUATION;  Surgeon: Zelphia Cairo, MD;  Location: [facility];  Service: Gynecology;  Laterality: N/A;  . Leep     History reviewed. No pertinent family history.  History  Substance Use Topics  . Smoking status: Not on file  . Smokeless tobacco: Not on file  . Alcohol Use: No    Allergies: No Known Allergies  Prescriptions prior to admission  Medication Sig Dispense Refill  . GREEN COFFEE BEAN PO Take 1 tablet by mouth daily.      . Prenatal Vit-Fe Fumarate-FA (PRENATAL MULTIVITAMIN) TABS Take 1 tablet by mouth daily.        Review of Systems  Constitutional: Negative for fever.  Gastrointestinal: Negative for nausea, vomiting and abdominal pain.  Genitourinary:       Vaginal bleeding   Physical Exam   Blood pressure 120/70, pulse 63, temperature 98.3 F (36.8 C), temperature source Oral, resp. rate 16, height 5\' 7"  (1.702 m), weight 154 lb (69.854 kg), last menstrual period 03/11/2012, SpO2 100.00%.  Physical Exam  Nursing note and vitals reviewed. Constitutional: She is oriented to person, place, and time. She appears well-developed and well-nourished.  HENT:  Head: Normocephalic.  Eyes: EOM are normal.   Neck: Neck supple.  Musculoskeletal: Normal range of motion.  Neurological: She is alert and oriented to person, place, and time.  Skin: Skin is warm and dry.  Psychiatric: She has a normal mood and affect.    MAU Course  Procedures  MDM Consult with Dr. Renaldo Fiddler re: plan of care.  Will come to see client. Courtney Tyler 03/17/2012, 10:07 PM

## 2012-03-17 NOTE — Progress Notes (Signed)
Quant dropped to 214 however [place] still shows large amount of tissue in uterus.    Rec Ultrasound guided dilation and evacuation.  R/b/a discussed with patient and husband.  Informed consent

## 2012-03-18 ENCOUNTER — Inpatient Hospital Stay (HOSPITAL_COMMUNITY): Payer: BC Managed Care – PPO

## 2012-03-18 ENCOUNTER — Encounter (HOSPITAL_COMMUNITY): Payer: Self-pay

## 2012-03-18 ENCOUNTER — Encounter (HOSPITAL_COMMUNITY): Payer: Self-pay | Admitting: Anesthesiology

## 2012-03-18 ENCOUNTER — Inpatient Hospital Stay (HOSPITAL_COMMUNITY): Payer: BC Managed Care – PPO | Admitting: Anesthesiology

## 2012-03-18 LAB — ABO/RH: ABO/RH(D): O POS

## 2012-03-18 MED ORDER — ONDANSETRON HCL 4 MG/2ML IJ SOLN: 4.0000 mg | Freq: Once | INTRAMUSCULAR | Status: DC | PRN

## 2012-03-18 MED ORDER — KETOROLAC TROMETHAMINE 30 MG/ML IJ SOLN
INTRAMUSCULAR | Status: AC
  Administered 2012-03-18: 30 mg via INTRAVENOUS
  Filled 2012-03-18: qty 1

## 2012-03-18 MED ORDER — MEPERIDINE HCL 25 MG/ML IJ SOLN: 6.2500 mg | INTRAMUSCULAR | Status: DC | PRN

## 2012-03-18 MED ORDER — PROPOFOL 10 MG/ML IV EMUL
INTRAVENOUS | Status: DC | PRN
  Administered 2012-03-18: 160 mg via INTRAVENOUS

## 2012-03-18 MED ORDER — HYDROCODONE-ACETAMINOPHEN 5-325 MG PO TABS
ORAL_TABLET | ORAL | Status: AC
  Filled 2012-03-18: qty 1

## 2012-03-18 MED ORDER — LACTATED RINGERS IV SOLN
INTRAVENOUS | Status: DC | PRN
  Administered 2012-03-18 (×3): via INTRAVENOUS

## 2012-03-18 MED ORDER — ROCURONIUM BROMIDE 50 MG/5ML IV SOLN
INTRAVENOUS | Status: AC
  Filled 2012-03-18: qty 1

## 2012-03-18 MED ORDER — FENTANYL CITRATE 0.05 MG/ML IJ SOLN
INTRAMUSCULAR | Status: AC
  Filled 2012-03-18: qty 5

## 2012-03-18 MED ORDER — ROCURONIUM BROMIDE 100 MG/10ML IV SOLN
INTRAVENOUS | Status: DC | PRN
  Administered 2012-03-18: 5 mg via INTRAVENOUS
  Administered 2012-03-18: 2.5 mg via INTRAVENOUS
  Administered 2012-03-18: 20 mg via INTRAVENOUS

## 2012-03-18 MED ORDER — NEOSTIGMINE METHYLSULFATE 1 MG/ML IJ SOLN
INTRAMUSCULAR | Status: AC
  Filled 2012-03-18: qty 10

## 2012-03-18 MED ORDER — EPHEDRINE SULFATE 50 MG/ML IJ SOLN
INTRAMUSCULAR | Status: DC | PRN
  Administered 2012-03-18: 5 mg via INTRAVENOUS

## 2012-03-18 MED ORDER — KETOROLAC TROMETHAMINE 30 MG/ML IJ SOLN
15.0000 mg | Freq: Once | INTRAMUSCULAR | Status: AC | PRN
  Administered 2012-03-18: 30 mg via INTRAVENOUS

## 2012-03-18 MED ORDER — DEXAMETHASONE SODIUM PHOSPHATE 4 MG/ML IJ SOLN
INTRAMUSCULAR | Status: DC | PRN
  Administered 2012-03-18: 10 mg via INTRAVENOUS

## 2012-03-18 MED ORDER — GLYCOPYRROLATE 0.2 MG/ML IJ SOLN
INTRAMUSCULAR | Status: AC
  Filled 2012-03-18: qty 2

## 2012-03-18 MED ORDER — METHYLERGONOVINE MALEATE 0.2 MG/ML IJ SOLN
0.2000 mg | Freq: Once | INTRAMUSCULAR | Status: AC
  Administered 2012-03-18: 0.2 mg via INTRAMUSCULAR

## 2012-03-18 MED ORDER — NEOSTIGMINE METHYLSULFATE 1 MG/ML IJ SOLN
INTRAMUSCULAR | Status: DC | PRN
  Administered 2012-03-18: 3 mg via INTRAVENOUS

## 2012-03-18 MED ORDER — MIDAZOLAM HCL 2 MG/2ML IJ SOLN
INTRAMUSCULAR | Status: AC
  Filled 2012-03-18: qty 2

## 2012-03-18 MED ORDER — OXYTOCIN 10 UNIT/ML IJ SOLN
INTRAMUSCULAR | Status: AC
  Filled 2012-03-18: qty 4

## 2012-03-18 MED ORDER — METHYLERGONOVINE MALEATE 0.2 MG/ML IJ SOLN
INTRAMUSCULAR | Status: AC
  Administered 2012-03-18: 0.2 mg via INTRAMUSCULAR
  Filled 2012-03-18: qty 1

## 2012-03-18 MED ORDER — MIDAZOLAM HCL 5 MG/5ML IJ SOLN
INTRAMUSCULAR | Status: DC | PRN
  Administered 2012-03-18: 2 mg via INTRAVENOUS

## 2012-03-18 MED ORDER — HYDROCODONE-ACETAMINOPHEN 5-500 MG PO TABS: 1.0000 | ORAL_TABLET | Freq: Four times a day (QID) | ORAL | Status: AC | PRN

## 2012-03-18 MED ORDER — LIDOCAINE HCL (CARDIAC) 20 MG/ML IV SOLN
INTRAVENOUS | Status: AC
  Filled 2012-03-18: qty 5

## 2012-03-18 MED ORDER — FENTANYL CITRATE 0.05 MG/ML IJ SOLN: 25.0000 ug | INTRAMUSCULAR | Status: DC | PRN

## 2012-03-18 MED ORDER — METHYLERGONOVINE MALEATE 0.2 MG PO TABS: 0.2000 mg | ORAL_TABLET | Freq: Three times a day (TID) | ORAL | Status: DC

## 2012-03-18 MED ORDER — ONDANSETRON HCL 4 MG/2ML IJ SOLN
INTRAMUSCULAR | Status: AC
  Filled 2012-03-18: qty 2

## 2012-03-18 MED ORDER — HYDROCODONE-ACETAMINOPHEN 5-325 MG PO TABS
1.0000 | ORAL_TABLET | Freq: Once | ORAL | Status: AC
  Administered 2012-03-18: 1 via ORAL

## 2012-03-18 MED ORDER — SUCCINYLCHOLINE CHLORIDE 20 MG/ML IJ SOLN
INTRAMUSCULAR | Status: DC | PRN
  Administered 2012-03-18: 120 mg via INTRAVENOUS

## 2012-03-18 MED ORDER — LIDOCAINE HCL (CARDIAC) 20 MG/ML IV SOLN
INTRAVENOUS | Status: DC | PRN
  Administered 2012-03-18: 40 mg via INTRAVENOUS

## 2012-03-18 MED ORDER — PROPOFOL 10 MG/ML IV EMUL
INTRAVENOUS | Status: AC
  Filled 2012-03-18: qty 20

## 2012-03-18 MED ORDER — EPHEDRINE 5 MG/ML INJ
INTRAVENOUS | Status: AC
  Filled 2012-03-18: qty 10

## 2012-03-18 MED ORDER — CHLOROPROCAINE HCL 1 % IJ SOLN
INTRAMUSCULAR | Status: AC
  Filled 2012-03-18: qty 30

## 2012-03-18 MED ORDER — LACTATED RINGERS IV SOLN
INTRAVENOUS | Status: DC
  Administered 2012-03-18: 125 mL/h via INTRAVENOUS

## 2012-03-18 MED ORDER — GLYCOPYRROLATE 0.2 MG/ML IJ SOLN
INTRAMUSCULAR | Status: DC | PRN
  Administered 2012-03-18: 0.6 mg via INTRAVENOUS

## 2012-03-18 MED ORDER — SUCCINYLCHOLINE CHLORIDE 20 MG/ML IJ SOLN
INTRAMUSCULAR | Status: AC
  Filled 2012-03-18: qty 10

## 2012-03-18 MED ORDER — FENTANYL CITRATE 0.05 MG/ML IJ SOLN
INTRAMUSCULAR | Status: DC | PRN
  Administered 2012-03-18: 100 ug via INTRAVENOUS

## 2012-03-18 MED ORDER — DEXAMETHASONE SODIUM PHOSPHATE 10 MG/ML IJ SOLN
INTRAMUSCULAR | Status: AC
  Filled 2012-03-18: qty 1

## 2012-03-18 MED ORDER — ONDANSETRON HCL 4 MG/2ML IJ SOLN
INTRAMUSCULAR | Status: DC | PRN
  Administered 2012-03-18: 4 mg via INTRAVENOUS

## 2012-03-18 NOTE — Anesthesia Procedure Notes (Signed)
Procedure Name: Intubation Performed by: Amaris Delafuente D Pre-anesthesia Checklist: Patient identified, Emergency Drugs available, Suction available, Timeout performed and Patient being monitored Patient Re-evaluated:Patient Re-evaluated prior to inductionOxygen Delivery Method: Circle system utilized Preoxygenation: Pre-oxygenation with 100% oxygen Intubation Type: IV induction, Rapid sequence and Cricoid Pressure applied Laryngoscope Size: Mac and 3 Grade View: Grade I Tube type: Oral Tube size: 7.0 mm Number of attempts: 1 Airway Equipment and Method: Stylet Placement Confirmation: ETT inserted through vocal cords under direct vision,  breath sounds checked- equal and bilateral and positive ETCO2 Secured at: 21 cm Dental Injury: Teeth and Oropharynx as per pre-operative assessment

## 2012-03-18 NOTE — Transfer of Care (Signed)
Immediate Anesthesia Transfer of Care Note  Patient: Courtney Tyler  Procedure(s) Performed: Procedure(s) (LRB): DILATATION AND EVACUATION (N/A)  Patient Location: PACU  Anesthesia Type: General  Level of Consciousness: awake, alert , oriented and sedated  Airway & Oxygen Therapy: Patient Spontanous Breathing and Patient connected to nasal cannula oxygen  Post-op Assessment: Report given to PACU RN and Post -op Vital signs reviewed and stable  Post vital signs: stable  Complications: No apparent anesthesia complications

## 2012-03-18 NOTE — Anesthesia Preprocedure Evaluation (Signed)
Anesthesia Evaluation  Patient identified by MRN, date of birth, ID band Patient awake    Reviewed: Allergy & Precautions, H&P , NPO status , Patient's Chart, lab work & pertinent test results  Airway Mallampati: I TM Distance: >3 FB Neck ROM: full    Dental No notable dental hx.    Pulmonary neg pulmonary ROS,    Pulmonary exam normal       Cardiovascular negative cardio ROS      Neuro/Psych negative neurological ROS  negative psych ROS   GI/Hepatic negative GI ROS, Neg liver ROS,   Endo/Other  negative endocrine ROS  Renal/GU negative Renal ROS  negative genitourinary   Musculoskeletal negative musculoskeletal ROS (+)   Abdominal Normal abdominal exam  (+)   Peds negative pediatric ROS (+)  Hematology negative hematology ROS (+)   Anesthesia Other Findings   Reproductive/Obstetrics (+) Pregnancy                           Anesthesia Physical Anesthesia Plan  ASA: II  Anesthesia Plan: General   Post-op Pain Management:    Induction: Intravenous, Rapid sequence and Cricoid pressure planned  Airway Management Planned: Oral ETT  Additional Equipment:   Intra-op Plan:   Post-operative Plan: Extubation in OR  Informed Consent: I have reviewed the patients History and Physical, chart, labs and discussed the procedure including the risks, benefits and alternatives for the proposed anesthesia with the patient or authorized representative who has indicated his/her understanding and acceptance.   Dental advisory given  Plan Discussed with: CRNA and Surgeon  Anesthesia Plan Comments:         Anesthesia Quick Evaluation

## 2012-03-18 NOTE — Anesthesia Postprocedure Evaluation (Signed)
Anesthesia Post Note  Patient: Courtney Tyler  Procedure(s) Performed: Procedure(s) (LRB): DILATATION AND EVACUATION (N/A)  Anesthesia type: General  Patient location: PACU  Post pain: Pain level controlled  Post assessment: Post-op Vital signs reviewed  Last Vitals:  Filed Vitals:   03/18/12 0245  BP: 104/57  Pulse: 64  Temp:   Resp: 12    Post vital signs: Reviewed  Level of consciousness: sedated  Complications: No apparent anesthesia complicationsfj

## 2012-03-18 NOTE — Op Note (Signed)
NAMESERENITEE, Courtney Tyler NO.:  [id number]  MEDICAL RECORD NO.:  [id number]  LOCATION:  [facility]                          [facility]  PHYSICIAN:  Zelphia Cairo, MD    DATE OF BIRTH:  14-Sep-1985  DATE OF PROCEDURE: DATE OF DISCHARGE:  03/18/2012                              OPERATIVE REPORT   PREOPERATIVE DIAGNOSES: 1. Irregular vaginal bleeding. 2. Plateauing quantitative beta hCG. 3. Retained products of conception versus incomplete abortion.  PROCEDURE:  Ultrasound-guided dilation and curettage.  SURGEON:  Zelphia Cairo, MD  ANESTHESIA:  General.  COMPLICATIONS:  None.  CONDITION:  Stable to recovery room.  PROCEDURE:  The patient was taken to the operating room.  After informed consent was obtained, she was prepped and draped in sterile fashion, and an in-and-out catheter was used to drain her bladder for 50 mL of clear urine.  Bivalve speculum was placed in the vagina.  Single-tooth tenaculum was placed on the anterior lip of the cervix.  The cervix was serially dilated using Pratt dilators.  A 9-French suction catheter was inserted into the endometrial cavity and two passes with the suction curette were performed.  Minimal tissue was obtained.  Ultrasound appearance did not change.  A gentle curetting was then performed under ultrasound guidance.  A small amount of tissue was obtained.  Suction catheter was reinserted; however, minimal tissue and blood were obtained.  Because the ultrasound continued to show large amounts of tissue very close to the fundus and my fear for uterine perforation, another physician (Dr. Shawnie Pons) was called into the OR for a second opinion.  Two more passes with the curette were performed; however, only clot was removed.  No further tissue or possible products of conception were obtained.  Suction curette was reinserted.  No tissue was removed. Even though there appeared to still be some clot and debris within the uterine  cavity, our concern was for uterine perforation because it extended so close to the uterine fundus.  The fundus and uterus were firm and the patient was hemostatic.  Therefore, the procedure was discontinued.  Tenaculum was removed.  Her cervix was hemostatic. Speculum was removed.  She was taken to the recovery room in stable condition.  Sponge, lap, needle, and instrument counts were correct x2. We will follow serial quantitative beta hCGs to 0.     Zelphia Cairo, MD     GA/MEDQ  D:  03/18/2012  T:  03/18/2012  Job:  [id number]

## 2012-03-19 ENCOUNTER — Encounter (HOSPITAL_COMMUNITY): Payer: Self-pay | Admitting: Obstetrics and Gynecology

## 2012-03-22 ENCOUNTER — Ambulatory Visit: Admit: 2012-03-22 | Payer: Self-pay | Admitting: Obstetrics and Gynecology

## 2012-03-22 SURGERY — DILATION AND EVACUATION, UTERUS
Anesthesia: Choice

## 2012-03-29 ENCOUNTER — Inpatient Hospital Stay (HOSPITAL_COMMUNITY)
Admission: AD | Admit: 2012-03-29 | Discharge: 2012-03-29 | Disposition: A | Discharge status: Home / Self Care | Payer: BC Managed Care – PPO | Source: Ambulatory Visit | Attending: Obstetrics and Gynecology | Admitting: Obstetrics and Gynecology

## 2012-03-29 ENCOUNTER — Inpatient Hospital Stay (HOSPITAL_COMMUNITY): Payer: BC Managed Care – PPO

## 2012-03-29 ENCOUNTER — Encounter (HOSPITAL_COMMUNITY): Payer: Self-pay | Admitting: *Deleted

## 2012-03-29 DIAGNOSIS — O039 Complete or unspecified spontaneous abortion without complication: Secondary | ICD-10-CM

## 2012-03-29 NOTE — MAU Provider Note (Signed)
Courtney Tyler [id number] y.o.G3P102 Chief Complaint  Patient presents with  . Vaginal Bleeding   First Provider Initiated Contact with Patient 03/29/12 1939    SUBJECTIVE HPI: Courtney Tyler is a 26 y.o. year old [id number] Tyler 11 days S/P incomplete D&C for incomplete AB who presents to MAU reporting passing tissue this afternoon. She came to MAU at the suggestion of the office nurse for evaluation of possible infection, however the pt denies fever, chills, abd pain. Bleeding is light. Last quant in office in the 20's. [place] during D&C showed possible abnormal tissue in fundus per Dr. Renaldo Fiddler.  Past Medical History  Diagnosis Date  . Abnormal Pap smear 2006    colpo & LEEP   Past Surgical History  Procedure Date  . Vaginal delivery 07/16/10  . Dilation and evacuation 01/23/2012    Procedure: DILATATION AND EVACUATION;  Surgeon: Zelphia Cairo, MD;  Location: [facility];  Service: Gynecology;  Laterality: N/A;  . Leep   . Dilation and evacuation 03/17/2012    Procedure: DILATATION AND EVACUATION;  Surgeon: Zelphia Cairo, MD;  Location: [facility];  Service: Gynecology;  Laterality: N/A;   History   Social History  . Marital Status: Married    Spouse Name: N/A    Number of Children: N/A  . Years of Education: N/A   Occupational History  . Not on file.   Social History Main Topics  . Smoking status: Not on file  . Smokeless tobacco: Not on file  . Alcohol Use: No  . Drug Use: No  . Sexually Active: Yes   Other Topics Concern  . Not on file   Social History Narrative  . No narrative on file   No current facility-administered medications on file prior to encounter.   Current Outpatient Prescriptions on File Prior to Encounter  Medication Sig Dispense Refill  . methylergonovine (METHERGINE) 0.2 MG tablet Take 1 tablet (0.2 mg total) by mouth every 8 (eight) hours.  9 tablet  0  . Prenatal Vit-Fe Fumarate-FA (PRENATAL MULTIVITAMIN) TABS Take 1 tablet by mouth daily.      Marland Kitchen  HYDROcodone-acetaminophen (VICODIN) 5-500 MG per tablet Take 1 tablet by mouth every 6 (six) hours as needed for pain.  30 tablet  0   No Known Allergies  ROS: Pertinent items in HPI  OBJECTIVE Blood pressure 124/76, pulse 53, temperature 98.7 F (37.1 C), temperature source Oral, resp. rate 18, height 5\' 7"  (1.702 m), weight 71.487 kg (157 lb 9.6 oz), last menstrual period 03/11/2012, not currently breastfeeding.  GENERAL: Well-developed, well-nourished Tyler in no acute distress.  HEENT: Normocephalic, good dentition HEART: normal rate RESP: normal effort ABDOMEN: Soft, nontender EXTREMITIES: Nontender, no edema NEURO: Alert and oriented SPECULUM EXAM: Deferred. Scant blood on pad.   LAB RESULTS  ED COURSE [place] ordered per consult w/ Dr. Vincente Poli.   Care of pt turned over to [facility], NP at 2000.  [place], [place] 03/29/2012 8:40 PM  IMAGING   *RADIOLOGY REPORT*  Clinical Data: Status post D and E 07/14,2013. Question retained  tissue.  TRANSVAGINAL ULTRASOUND OF PELVIS  Technique: Transvaginal ultrasound examination of the pelvis was  performed including evaluation of the uterus, ovaries, adnexal  regions, and pelvic cul-de-sac.  Comparison: Back pelvic ultrasound 03/17/2012  Findings:  Uterus: The uterus appears normal, measuring 7.5 x 3.6 x 6.2 cm.  The myometrium is homogeneous.  Endometrium: Normal in thickness and appearance (4.3 mm thickness).  No evidence of retained products of conception.  Right ovary:  Normal in appearance measuring 2.6 x 1.6 x 2.2 cm.  Left ovary: Not visualized. No evidence of adnexal mass.  Other Findings: No free pelvic fluid. Adnexal evaluation is  limited by bowel gas.  IMPRESSION:  Normal examination without evidence of retained products of  conception. Left ovary is not visualized.  Original Report Authenticated By: Gerrianne Scale, M.D.   ASSESSMENT: Complete SAB   PLAN:  Follow up in the office   Call tomorrow

## 2012-03-29 NOTE — MAU Note (Signed)
In here 2 weeks ago, has had 2 miscarriages. Had D/C 2 weeks ago for heavy bleeding.  During surgery states that she had tissue growing outside the uterus.  Has been bleeding.  Passed some tissue. Spoke with after hours nurse who suggested she come for eval

## 2012-03-29 NOTE — MAU Note (Signed)
Pt presents s/p D and C on 03/18/12.  Pt states she knows that she has tissue going outside of her uterus but they were waiting for her HcG to drop to 0.  Pt was concered because after running today she passed a large about of dark brown tissue and is going out of town tomorrow. Pt had called the after hours RN this evening.

## 2012-09-16 ENCOUNTER — Encounter (HOSPITAL_COMMUNITY): Payer: Self-pay | Admitting: *Deleted

## 2012-09-16 ENCOUNTER — Inpatient Hospital Stay (HOSPITAL_COMMUNITY)
Admission: AD | Admit: 2012-09-16 | Discharge: 2012-09-16 | Disposition: A | Discharge status: Home / Self Care | Payer: BC Managed Care – PPO | Source: Ambulatory Visit | Attending: Obstetrics and Gynecology | Admitting: Obstetrics and Gynecology

## 2012-09-16 DIAGNOSIS — O209 Hemorrhage in early pregnancy, unspecified: Secondary | ICD-10-CM

## 2012-09-16 DIAGNOSIS — R109 Unspecified abdominal pain: Secondary | ICD-10-CM | POA: Insufficient documentation

## 2012-09-16 NOTE — MAU Provider Note (Signed)
Chief Complaint  Patient presents with  . Vaginal Bleeding    Subjective Courtney Tyler 27 y.o.  [id number] [redacted]w[redacted]d presents with onset today of first episode of quarter size amount pink-red vaginal bleeding, then had pink streaking on toilet paper. Has menstrual-like crampy lower abdominal pain. No antecedent intercourse Denies abnormal vaginal discharge or irritation. No dysuria or hematuria.  Blood type: O pos Had office [place] last week> viable IUP  Pertinent medical history: [place] Pertinent Ob/Gyn history: SAB x 2 Pertinent surgical history: D&C x 2  Meds: PNV   Objective   Filed Vitals:   09/16/12 1549  BP: 119/70  Pulse: 58  Temp: 98.1 F (36.7 C)  Resp: 18     Physical Exam General: WN/WD in NAD but very anxious Abdom: soft, NT Pelvic:External genitalia: normal; BUS neg             Spec: no blood                      Cx everted, not friable with exam, no lesions, appears closed           Bimanual: Cx closed, long                             Uterus anteverted, NT,8 weeks size                             Adnexae non tender, no masses   Lab Results No results found for this or any previous visit (from the past 24 hour(s)).  Ultrasound Bedside US by me: cardiac activity viewed by couple   Assessment G4P1021 at [redacted]w[redacted]d, viable Hx of early pregnancy bleeding   Plan    C/W Dr. Rana Snare Discharge with AVS on Early Pregnancy Bleeding Keep NOB appt in 2 days  Courtney Tyler 09/16/2012 4:15 PM

## 2012-09-16 NOTE — MAU Note (Signed)
Pt reports having some vaginal bleeding and spotting that started today. Reports having some dull abd cramping as well. Had U/S on TUes and was told everything was OK.

## 2012-09-18 LAB — OB RESULTS CONSOLE ABO/RH: RH Type: POSITIVE

## 2012-09-18 LAB — OB RESULTS CONSOLE HIV ANTIBODY (ROUTINE TESTING): HIV: NONREACTIVE

## 2012-09-18 LAB — OB RESULTS CONSOLE RUBELLA ANTIBODY, IGM: Rubella: IMMUNE

## 2012-09-25 ENCOUNTER — Other Ambulatory Visit: Payer: Self-pay | Admitting: Obstetrics and Gynecology

## 2013-03-27 LAB — OB RESULTS CONSOLE GBS: GBS: NEGATIVE

## 2013-04-17 ENCOUNTER — Other Ambulatory Visit (HOSPITAL_COMMUNITY): Payer: Self-pay | Admitting: Obstetrics and Gynecology

## 2013-04-17 ENCOUNTER — Ambulatory Visit (HOSPITAL_COMMUNITY)
Admission: RE | Admit: 2013-04-17 | Discharge: 2013-04-17 | Disposition: A | Discharge status: Home / Self Care | Payer: BC Managed Care – PPO | Source: Ambulatory Visit | Attending: Obstetrics and Gynecology | Admitting: Obstetrics and Gynecology

## 2013-04-17 DIAGNOSIS — M25569 Pain in unspecified knee: Secondary | ICD-10-CM | POA: Insufficient documentation

## 2013-04-17 DIAGNOSIS — M7989 Other specified soft tissue disorders: Secondary | ICD-10-CM

## 2013-04-17 DIAGNOSIS — M25562 Pain in left knee: Secondary | ICD-10-CM

## 2013-04-17 DIAGNOSIS — M79609 Pain in unspecified limb: Secondary | ICD-10-CM

## 2013-04-17 NOTE — Progress Notes (Signed)
Left lower extremity venous duplex completed.  Left:  No evidence of DVT, superficial thrombosis, or Baker's cyst.  Right:  Negative for DVT in the common femoral vein.  

## 2013-04-29 ENCOUNTER — Telehealth (HOSPITAL_COMMUNITY): Payer: Self-pay | Admitting: *Deleted

## 2013-04-29 ENCOUNTER — Encounter (HOSPITAL_COMMUNITY): Payer: Self-pay | Admitting: *Deleted

## 2013-04-29 NOTE — Telephone Encounter (Signed)
Preadmission screen  

## 2013-05-02 ENCOUNTER — Encounter (HOSPITAL_COMMUNITY): Payer: Self-pay

## 2013-05-02 ENCOUNTER — Inpatient Hospital Stay (HOSPITAL_COMMUNITY)
Admission: RE | Admit: 2013-05-02 | Discharge: 2013-05-04 | DRG: 373 | Disposition: A | Discharge status: Home / Self Care | Payer: BC Managed Care – PPO | Source: Ambulatory Visit | Attending: Obstetrics and Gynecology | Admitting: Obstetrics and Gynecology

## 2013-05-02 DIAGNOSIS — O480 Post-term pregnancy: Principal | ICD-10-CM | POA: Diagnosis present

## 2013-05-02 LAB — CBC
HCT: 34.4 % — ABNORMAL LOW (ref 36.0–46.0)
Hemoglobin: 12 g/dL (ref 12.0–15.0)
MCH: 30.9 pg (ref 26.0–34.0)
RBC: 3.88 MIL/uL (ref 3.87–5.11)

## 2013-05-02 MED ORDER — OXYCODONE-ACETAMINOPHEN 5-325 MG PO TABS: 1.0000 | ORAL_TABLET | ORAL | Status: DC | PRN

## 2013-05-02 MED ORDER — TERBUTALINE SULFATE 1 MG/ML IJ SOLN: 0.2500 mg | Freq: Once | INTRAMUSCULAR | Status: AC | PRN

## 2013-05-02 MED ORDER — MISOPROSTOL 25 MCG QUARTER TABLET
25.0000 ug | ORAL_TABLET | ORAL | Status: AC | PRN
  Administered 2013-05-02 – 2013-05-03 (×2): 25 ug via VAGINAL
  Filled 2013-05-02 (×2): qty 0.25

## 2013-05-02 MED ORDER — DIPHENHYDRAMINE HCL 50 MG/ML IJ SOLN: 12.5000 mg | INTRAMUSCULAR | Status: DC | PRN

## 2013-05-02 MED ORDER — OXYTOCIN 40 UNITS IN LACTATED RINGERS INFUSION - SIMPLE MED
1.0000 m[IU]/min | INTRAVENOUS | Status: DC
  Administered 2013-05-03: 2 m[IU]/min via INTRAVENOUS
  Filled 2013-05-02: qty 1000

## 2013-05-02 MED ORDER — ZOLPIDEM TARTRATE 5 MG PO TABS
5.0000 mg | ORAL_TABLET | Freq: Every evening | ORAL | Status: DC | PRN
  Administered 2013-05-02: 5 mg via ORAL
  Filled 2013-05-02: qty 1

## 2013-05-02 MED ORDER — PHENYLEPHRINE 40 MCG/ML (10ML) SYRINGE FOR IV PUSH (FOR BLOOD PRESSURE SUPPORT)
80.0000 ug | PREFILLED_SYRINGE | INTRAVENOUS | Status: DC | PRN
  Filled 2013-05-02: qty 2
  Filled 2013-05-02: qty 5

## 2013-05-02 MED ORDER — LIDOCAINE HCL (PF) 1 % IJ SOLN
30.0000 mL | INTRAMUSCULAR | Status: DC | PRN
  Filled 2013-05-02: qty 30

## 2013-05-02 MED ORDER — ONDANSETRON HCL 4 MG/2ML IJ SOLN: 4.0000 mg | Freq: Four times a day (QID) | INTRAMUSCULAR | Status: DC | PRN

## 2013-05-02 MED ORDER — FENTANYL 2.5 MCG/ML BUPIVACAINE 1/10 % EPIDURAL INFUSION (WH - ANES)
14.0000 mL/h | INTRAMUSCULAR | Status: DC | PRN
  Filled 2013-05-02: qty 125

## 2013-05-02 MED ORDER — EPHEDRINE 5 MG/ML INJ
10.0000 mg | INTRAVENOUS | Status: DC | PRN
  Filled 2013-05-02: qty 2

## 2013-05-02 MED ORDER — IBUPROFEN 600 MG PO TABS: 600.0000 mg | ORAL_TABLET | Freq: Four times a day (QID) | ORAL | Status: DC | PRN

## 2013-05-02 MED ORDER — OXYTOCIN BOLUS FROM INFUSION: 500.0000 mL | INTRAVENOUS | Status: DC

## 2013-05-02 MED ORDER — ACETAMINOPHEN 325 MG PO TABS: 650.0000 mg | ORAL_TABLET | ORAL | Status: DC | PRN

## 2013-05-02 MED ORDER — CITRIC ACID-SODIUM CITRATE 334-500 MG/5ML PO SOLN: 30.0000 mL | ORAL | Status: DC | PRN

## 2013-05-02 MED ORDER — EPHEDRINE 5 MG/ML INJ
10.0000 mg | INTRAVENOUS | Status: DC | PRN
  Filled 2013-05-02: qty 2
  Filled 2013-05-02: qty 4

## 2013-05-02 MED ORDER — LACTATED RINGERS IV SOLN
500.0000 mL | Freq: Once | INTRAVENOUS | Status: AC
  Administered 2013-05-03: 500 mL via INTRAVENOUS

## 2013-05-02 MED ORDER — OXYTOCIN 40 UNITS IN LACTATED RINGERS INFUSION - SIMPLE MED: 62.5000 mL/h | INTRAVENOUS | Status: DC

## 2013-05-02 MED ORDER — LACTATED RINGERS IV SOLN
500.0000 mL | INTRAVENOUS | Status: DC | PRN
  Administered 2013-05-03: 300 mL via INTRAVENOUS

## 2013-05-02 MED ORDER — LACTATED RINGERS IV SOLN
INTRAVENOUS | Status: DC
  Administered 2013-05-02: 21:00:00 via INTRAVENOUS

## 2013-05-02 MED ORDER — PHENYLEPHRINE 40 MCG/ML (10ML) SYRINGE FOR IV PUSH (FOR BLOOD PRESSURE SUPPORT)
80.0000 ug | PREFILLED_SYRINGE | INTRAVENOUS | Status: DC | PRN
  Filled 2013-05-02: qty 2

## 2013-05-02 NOTE — H&P (Signed)
Courtney Tyler is a 27 y.o. female presenting for postdates IOL.  Denies ctx, lof, or vb.  Good FM.  Pregnacny uncomplicated. History OB History   Grav Para Term Preterm Abortions TAB SAB Ect Mult Living   4 1 1  2  2   1      Past Medical History  Diagnosis Date  . Abnormal Pap smear 2006    colpo & LEEP  . Hx of varicella   . Depression     mild pp dep no meds   Past Surgical History  Procedure Laterality Date  . Vaginal delivery  07/16/10  . Dilation and evacuation  01/23/2012    Procedure: DILATATION AND EVACUATION;  Surgeon: Zelphia Cairo, MD;  Location: [facility];  Service: Gynecology;  Laterality: N/A;  . Leep    . Dilation and evacuation  03/17/2012    Procedure: DILATATION AND EVACUATION;  Surgeon: Zelphia Cairo, MD;  Location: [facility];  Service: Gynecology;  Laterality: N/A;   Family History: family history includes Cancer in her maternal grandfather; Migraines in her mother. Social History:  reports that she has never smoked. She has never used smokeless tobacco. She reports that she does not drink alcohol or use illicit drugs.   Prenatal Transfer Tool  Maternal Diabetes: No Genetic Screening: Normal Maternal Ultrasounds/Referrals: Normal Fetal Ultrasounds or other Referrals:  None Maternal Substance Abuse:  No Significant Maternal Medications:  None Significant Maternal Lab Results:  None Other Comments:  None  ROS    Blood pressure 121/73, pulse 77, temperature 98.2 F (36.8 C), temperature source Oral, resp. rate 18, height 5' 7.5" (1.715 m), weight 80.287 kg (177 lb), last menstrual period 07/20/2012. Exam Physical Exam  Prenatal labs: ABO, Rh: O/Positive/-- (01/14 0000) Antibody: Negative (01/14 0000) Rubella: Immune (01/14 0000) RPR: Nonreactive (01/14 0000)  HBsAg: Negative (01/14 0000)  HIV: Non-reactive (01/14 0000)  GBS: Negative (07/23 0000)   Assessment/Plan: Admit Induction of labor   Courtney Tyler 05/02/2013, 9:27 PM

## 2013-05-03 ENCOUNTER — Inpatient Hospital Stay (HOSPITAL_COMMUNITY): Payer: BC Managed Care – PPO | Admitting: Anesthesiology

## 2013-05-03 ENCOUNTER — Encounter (HOSPITAL_COMMUNITY): Payer: Self-pay | Admitting: Anesthesiology

## 2013-05-03 ENCOUNTER — Encounter (HOSPITAL_COMMUNITY): Payer: Self-pay

## 2013-05-03 ENCOUNTER — Inpatient Hospital Stay (HOSPITAL_COMMUNITY): Admission: RE | Admit: 2013-05-03 | Payer: BC Managed Care – PPO | Source: Ambulatory Visit

## 2013-05-03 LAB — RPR: RPR Ser Ql: NONREACTIVE

## 2013-05-03 MED ORDER — SENNOSIDES-DOCUSATE SODIUM 8.6-50 MG PO TABS
2.0000 | ORAL_TABLET | Freq: Every day | ORAL | Status: DC
  Administered 2013-05-03: 2 via ORAL

## 2013-05-03 MED ORDER — FENTANYL 2.5 MCG/ML BUPIVACAINE 1/10 % EPIDURAL INFUSION (WH - ANES)
INTRAMUSCULAR | Status: DC | PRN
  Administered 2013-05-03: 14 mL/h via EPIDURAL

## 2013-05-03 MED ORDER — LIDOCAINE HCL (PF) 1 % IJ SOLN
INTRAMUSCULAR | Status: DC | PRN
  Administered 2013-05-03 (×2): 4 mL

## 2013-05-03 MED ORDER — LANOLIN HYDROUS EX OINT: TOPICAL_OINTMENT | CUTANEOUS | Status: DC | PRN

## 2013-05-03 MED ORDER — BENZOCAINE-MENTHOL 20-0.5 % EX AERO
1.0000 "application " | INHALATION_SPRAY | CUTANEOUS | Status: DC | PRN
  Filled 2013-05-03: qty 56

## 2013-05-03 MED ORDER — MEDROXYPROGESTERONE ACETATE 150 MG/ML IM SUSP: 150.0000 mg | INTRAMUSCULAR | Status: DC | PRN

## 2013-05-03 MED ORDER — BUTORPHANOL TARTRATE 1 MG/ML IJ SOLN
1.0000 mg | Freq: Once | INTRAMUSCULAR | Status: AC
  Administered 2013-05-03: 1 mg via INTRAVENOUS

## 2013-05-03 MED ORDER — DIBUCAINE 1 % RE OINT: 1.0000 "application " | TOPICAL_OINTMENT | RECTAL | Status: DC | PRN

## 2013-05-03 MED ORDER — WITCH HAZEL-GLYCERIN EX PADS: 1.0000 "application " | MEDICATED_PAD | CUTANEOUS | Status: DC | PRN

## 2013-05-03 MED ORDER — ONDANSETRON HCL 4 MG/2ML IJ SOLN: 4.0000 mg | INTRAMUSCULAR | Status: DC | PRN

## 2013-05-03 MED ORDER — MEASLES, MUMPS & RUBELLA VAC ~~LOC~~ INJ
0.5000 mL | INJECTION | Freq: Once | SUBCUTANEOUS | Status: DC
  Filled 2013-05-03 (×2): qty 0.5

## 2013-05-03 MED ORDER — DIPHENHYDRAMINE HCL 25 MG PO CAPS: 25.0000 mg | ORAL_CAPSULE | Freq: Four times a day (QID) | ORAL | Status: DC | PRN

## 2013-05-03 MED ORDER — OXYCODONE-ACETAMINOPHEN 5-325 MG PO TABS
1.0000 | ORAL_TABLET | ORAL | Status: DC | PRN
  Administered 2013-05-03 – 2013-05-04 (×3): 1 via ORAL
  Filled 2013-05-03 (×3): qty 1

## 2013-05-03 MED ORDER — PRENATAL MULTIVITAMIN CH
1.0000 | ORAL_TABLET | Freq: Every day | ORAL | Status: DC
  Administered 2013-05-03 – 2013-05-04 (×2): 1 via ORAL
  Filled 2013-05-03 (×2): qty 1

## 2013-05-03 MED ORDER — IBUPROFEN 600 MG PO TABS
600.0000 mg | ORAL_TABLET | Freq: Four times a day (QID) | ORAL | Status: DC
  Administered 2013-05-03 – 2013-05-04 (×4): 600 mg via ORAL
  Filled 2013-05-03 (×4): qty 1

## 2013-05-03 MED ORDER — SIMETHICONE 80 MG PO CHEW: 80.0000 mg | CHEWABLE_TABLET | ORAL | Status: DC | PRN

## 2013-05-03 MED ORDER — TETANUS-DIPHTH-ACELL PERTUSSIS 5-2.5-18.5 LF-MCG/0.5 IM SUSP: 0.5000 mL | Freq: Once | INTRAMUSCULAR | Status: DC

## 2013-05-03 MED ORDER — ONDANSETRON HCL 4 MG PO TABS: 4.0000 mg | ORAL_TABLET | ORAL | Status: DC | PRN

## 2013-05-03 MED ORDER — BUTORPHANOL TARTRATE 1 MG/ML IJ SOLN
INTRAMUSCULAR | Status: AC
  Filled 2013-05-03: qty 1

## 2013-05-03 NOTE — Anesthesia Postprocedure Evaluation (Signed)
  Anesthesia Post-op Note  Patient: Courtney Tyler  Procedure(s) Performed: * No procedures listed *  Patient Location: PACU and Mother/Baby  Anesthesia Type:Epidural  Level of Consciousness: awake, alert  and oriented  Airway and Oxygen Therapy: Patient Spontanous Breathing  Post-op Pain: none  Post-op Assessment: Post-op Vital signs reviewed, Patient's Cardiovascular Status Stable, No headache, No backache, No residual numbness and No residual motor weakness  Post-op Vital Signs: Reviewed and stable  Complications: No apparent anesthesia complications

## 2013-05-03 NOTE — Progress Notes (Signed)
SVD of vigerous female infant w/ apgars of 9,9.  Placenta delivered spontaneous w/ 3VC.   2nd degree lac repaired w/ 3-0 vicryl rapide.  Fundus firm.  EBL 250cc .   

## 2013-05-03 NOTE — Anesthesia Preprocedure Evaluation (Signed)

## 2013-05-03 NOTE — Progress Notes (Signed)
Cx 3/90/-1/vtx FHT reactive UCs q2-3 min Epidural now

## 2013-05-03 NOTE — Anesthesia Procedure Notes (Signed)
Epidural Patient location during procedure: OB Start time: 05/03/2013 10:44 AM  Staffing Anesthesiologist: Kyron Schlitt A. Performed by: anesthesiologist   Preanesthetic Checklist Completed: patient identified, site marked, surgical consent, pre-op evaluation, timeout performed, IV checked, risks and benefits discussed and monitors and equipment checked  Epidural Patient position: sitting Prep: site prepped and draped and DuraPrep Patient monitoring: continuous pulse ox and blood pressure Approach: midline Injection technique: LOR air  Needle:  Needle type: Tuohy  Needle gauge: 17 G Needle length: 9 cm and 9 Needle insertion depth: 5 cm cm Catheter type: closed end flexible Catheter size: 19 Gauge Catheter at skin depth: 10 cm Test dose: negative and Other  Assessment Events: blood not aspirated, injection not painful, no injection resistance, negative IV test and no paresthesia  Additional Notes Patient identified. Risks and benefits discussed including failed block, incomplete  Pain control, post dural puncture headache, nerve damage, paralysis, blood pressure Changes, nausea, vomiting, reactions to medications-both toxic and allergic and post Partum back pain. All questions were answered. Patient expressed understanding and wished to proceed. Sterile technique was used throughout procedure. Epidural site was Dressed with sterile barrier dressing. No paresthesias, signs of intravascular injection Or signs of intrathecal spread were encountered.  Patient was more comfortable after the epidural was dosed. Please see RN's note for documentation of vital signs and FHR which are stable.

## 2013-05-04 LAB — CBC
HCT: 31.2 % — ABNORMAL LOW (ref 36.0–46.0)
Hemoglobin: 10.7 g/dL — ABNORMAL LOW (ref 12.0–15.0)
RBC: 3.46 MIL/uL — ABNORMAL LOW (ref 3.87–5.11)
WBC: 8.7 10*3/uL (ref 4.0–10.5)

## 2013-05-04 MED ORDER — IBUPROFEN 600 MG PO TABS: 600.0000 mg | ORAL_TABLET | Freq: Four times a day (QID) | ORAL | Status: DC | PRN

## 2013-05-04 MED ORDER — BENZOCAINE-MENTHOL 20-0.5 % EX AERO: 1.0000 "application " | INHALATION_SPRAY | CUTANEOUS | Status: DC | PRN

## 2013-05-04 MED ORDER — OXYCODONE-ACETAMINOPHEN 5-325 MG PO TABS: 1.0000 | ORAL_TABLET | Freq: Four times a day (QID) | ORAL | Status: DC | PRN

## 2013-05-04 NOTE — Discharge Summary (Signed)
Obstetric Discharge Summary Reason for Admission: induction of labor Prenatal Procedures: ultrasound Intrapartum Procedures: spontaneous vaginal delivery Postpartum Procedures: none Complications-Operative and Postpartum: none Hemoglobin  Date Value Range Status  05/04/2013 10.7* 12.0 - 15.0 g/dL Final     HCT  Date Value Range Status  05/04/2013 31.2* 36.0 - 46.0 % Final    Physical Exam:  General: alert, cooperative and no distress Lochia: appropriate Uterine Fundus: firm Incision: healing well DVT Evaluation: No evidence of DVT seen on physical exam.  Discharge Diagnoses: Term Pregnancy-delivered  Discharge Information: Date: 05/04/2013 Activity: pelvic rest Diet: routine Medications: PNV, Ibuprofen and Percocet Condition: stable Instructions: refer to practice specific booklet Discharge to: home   Newborn Data: Live born female  Birth Weight: 7 lb 1.1 oz (3205 g) APGAR: 9, 9  Home with mother.  Courtney Tyler,[name] 05/04/2013, 9:00 AM

## 2013-05-04 NOTE — Progress Notes (Signed)
Clinical Social Work Department PSYCHOSOCIAL ASSESSMENT - MATERNAL/CHILD 05/04/2013  Patient:  Courtney Tyler,Courtney Tyler  Account Number:  [id number]  Admit Date:  05/02/2013  Childs Name:   Courtney Tyler    Clinical Social Worker:  Ambreen Tufte, LCSW   Date/Time:  05/04/2013 01:45 PM  Date Referred:  05/03/2013   Referral source  CSW     Referred reason  Psychosocial assessment   Other referral source:    I:  FAMILY / HOME ENVIRONMENT Child's legal guardian:  PARENT  Guardian - Name Guardian - Age Guardian - Address  Pugh, Yailine [address]  [place] [address]  Cabal, Mark  same as above   Other household support members/support persons Other support:   Family and friends    II  PSYCHOSOCIAL DATA Information Source:  Patient Interview  Financial and Community Resources Employment:   Spouse is employeed.  Mother is a stay at home mom  Plan to see Dr. Hooker for Well Baby Care  Financial resources:  Private Insurance  School / Grade:   Maternity Care Coordinator / Child Services Coordination / Early Interventions:  Cultural issues impacting care:   none noted or reported at this time    III  STRENGTHS Strengths  Adequate Resources  Compliance with medical plan  Supportive family/friends  Home prepared for Child (including basic supplies)   Strength comment:    IV  RISK FACTORS AND CURRENT PROBLEMS Current Problem:       V  SOCIAL WORK ASSESSMENT Met with mother who was pleasant but not receptive to social work visit.  She seemed irritated that the consult was written for hx of depression which she denies.  She stated that she felt she had post partum depression with her other child.  However, she was never formally diagnosed and may have overreacted.  She also denies any hx of mental health treatment or substance abuse.   She is married and have one other dependent age 2.    Father plans to take off 2 weeks to support mother at home.  No acute  social concerns noted or reported at this time.     VI SOCIAL WORK PLAN Social Work Plan  No Further Intervention Required / No Barriers to Discharge   Type of pt/family education:   If child protective services report - county:   If child protective services report - date:   Information/referral to community resources comment:   Other social work plan:    Brazil Voytko J, LCSW 

## 2013-05-04 NOTE — Progress Notes (Signed)
Feels good Wants to go home  VSS Afeb FFNT  Results for orders placed during the hospital encounter of 05/02/13 (from the past 24 hour(s))  CBC     Status: Abnormal   Collection Time    05/04/13  6:09 AM      Result Value Range   WBC 8.7  4.0 - 10.5 K/uL   RBC 3.46 (*) 3.87 - 5.11 MIL/uL   Hemoglobin 10.7 (*) 12.0 - 15.0 g/dL   HCT 16.1 (*) 09.6 - 04.5 %   MCV 90.2  78.0 - 100.0 fL   MCH 30.9  26.0 - 34.0 pg   MCHC 34.3  30.0 - 36.0 g/dL   RDW 40.9  81.1 - 91.4 %   Platelets 156  150 - 400 K/uL   A: Satisfactory  P: D/C Home

## 2013-05-17 ENCOUNTER — Ambulatory Visit (HOSPITAL_COMMUNITY)
Admission: RE | Admit: 2013-05-17 | Discharge: 2013-05-17 | Disposition: A | Discharge status: Home / Self Care | Payer: BC Managed Care – PPO | Source: Ambulatory Visit | Attending: Obstetrics and Gynecology | Admitting: Obstetrics and Gynecology

## 2013-05-17 NOTE — Lactation Note (Signed)
Adult Lactation Consultation Outpatient Visit Note  Patient Name: Courtney Tyler  Baby: Alvera Novel Date of Birth: 10-29-1985   DOB: [birth year] Gestational Age at Delivery: [redacted]w[redacted]d  BW: 3205g, 7# 1.1oz Type of Delivery: NSVD   Today's weight: 3176g, 41# 62.70oz (106 weeks old)  Breastfeeding History: Frequency of Breastfeeding: q1h-3h; 5h stretch at night Length of Feeding: 5-20 min Voids:clear  Stools: yellow/seedy; 6 larger than the size of a quarter  Supplementing / Method: Pumping:  Type of Pump: Medela   Frequency: qd  Volume:  3.5 oz ( ) Supplemented X 1 (Sunday night): Dr. Manson Passey w/EBM; 2.5-3 oz Consultation Evaluation:  Initial Feeding Assessment: Pre-feed Weight: 3176g/3192g Post-feed Weight: 3196g/3212g Amount Transferred: 20mL Comments: L breast, football hold  Additional Feeding Assessment: Pre-feed Weight:3196g/3212g Post-feed Weight: 3258g/3274g    Amount Transferred:62 Comments: R breast, cross-cradle  Total Breast milk Transferred this Visit: 82mL  Follow-Up Baby has received breast milk only since birth.  Baby still not back to BW @ 2 weeks of life (lagging by 1 oz). There has been no weight gain in the last couple of days.  [name] breasts are not as full as expected at 2 weeks PP.   Baby fed well at bare breast, w/o difficulty, transferring almost 3 oz. Baby gets 3 times as much from R side than Left.  In light of baby not being back to/above BW yet, Mom counseled to start feedings on R side.  Offer L as able.  Mom to pump R side 4x/day and to pump L side 8x/day. Mom understands that she or Dad may give a bottle of EBM as needed.  Mom also understands that baby needs to gain 0.5oz-1.0oz/day.   Mom to call if additional questions arise.   Lurline Hare [facility] 05/17/2013, 9:05 AM

## 2013-06-13 IMAGING — US US OB TRANSVAGINAL
1 series · 13 of 28 positions shown · non-contrast
Comparison: Previous examinations, the most recent dated
04/07/2010.

CLINICAL DATA: Vaginal bleeding.  18 weeks and 1 day pregnant by
last menstrual period.  Quantitative beta HCG pending.

OBSTETRIC <14 WK US AND TRANSVAGINAL OB US
TECHNIQUE: Both transabdominal and transvaginal ultrasound
examinations were performed for complete evaluation of the
gestation as well as the maternal uterus, adnexal regions, and
pelvic cul-de-sac.  Transvaginal technique was performed to assess
early pregnancy.

[Series 1: us ob transvaginal · 0.30mm/px · 13 of 54 slices shown]
[im 2/54]
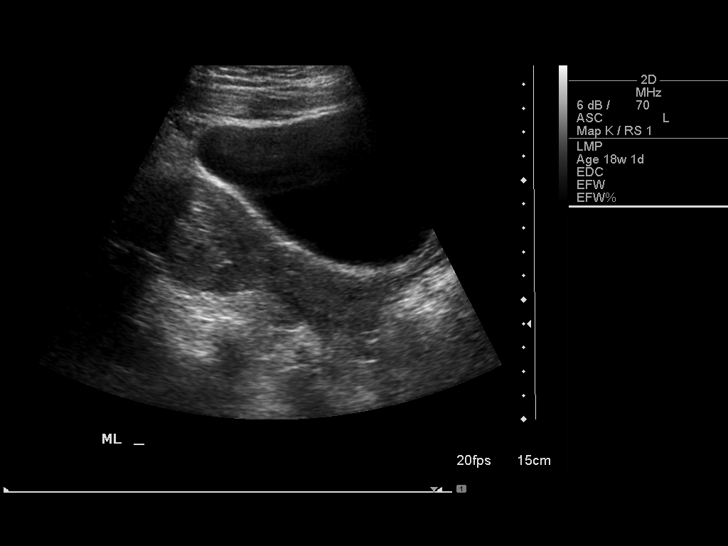
[im 6/54]
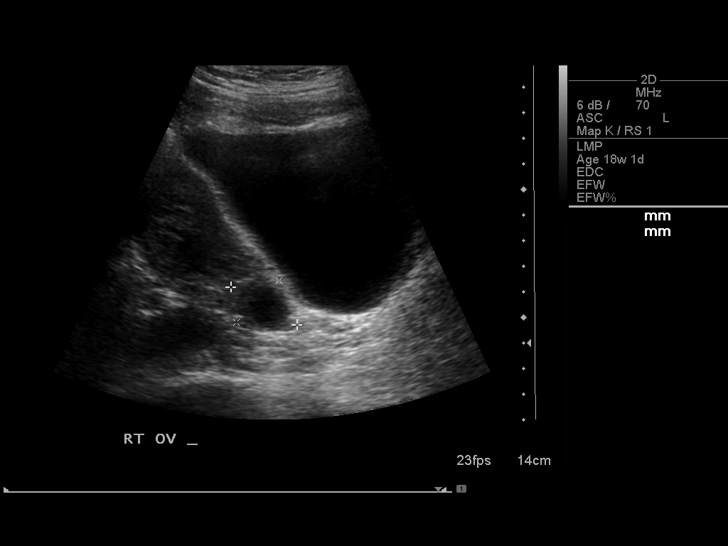
[im 10/54]
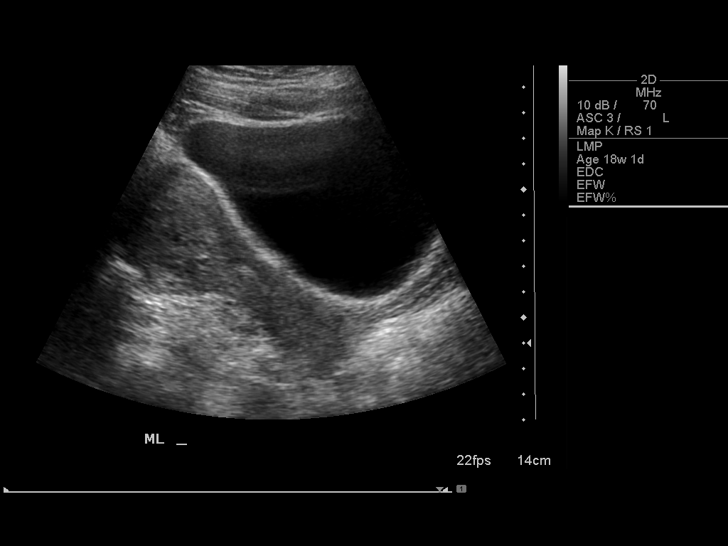
[im 14/54]
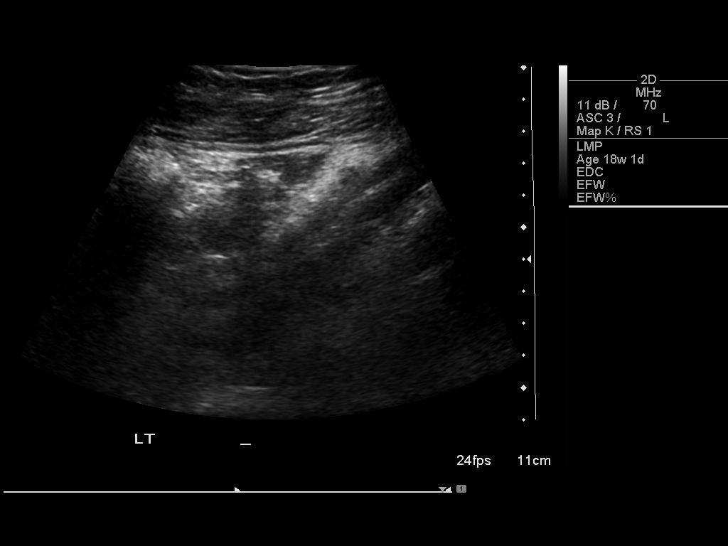
[im 18/54]
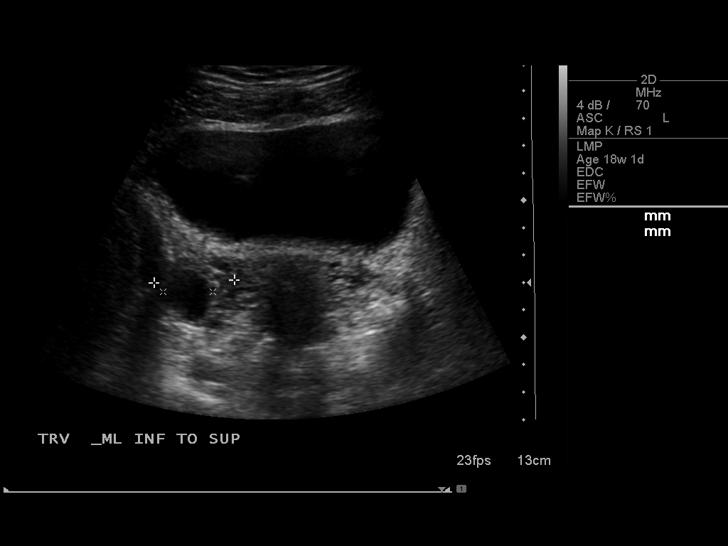
[im 22/54]
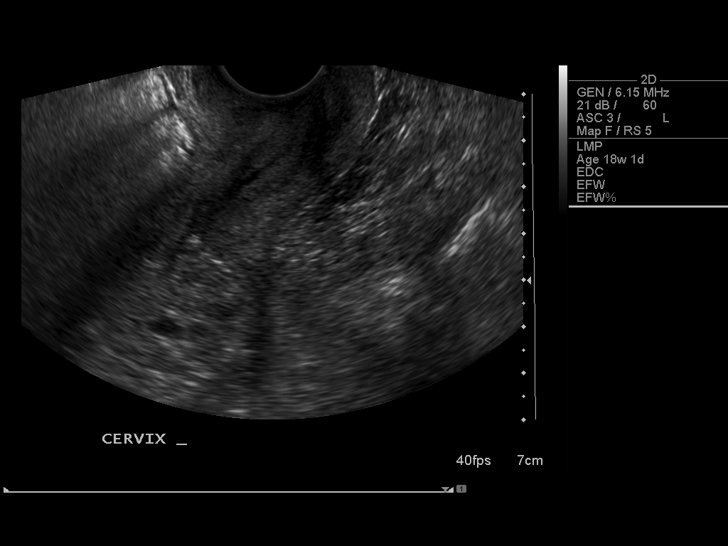
[im 28/54]
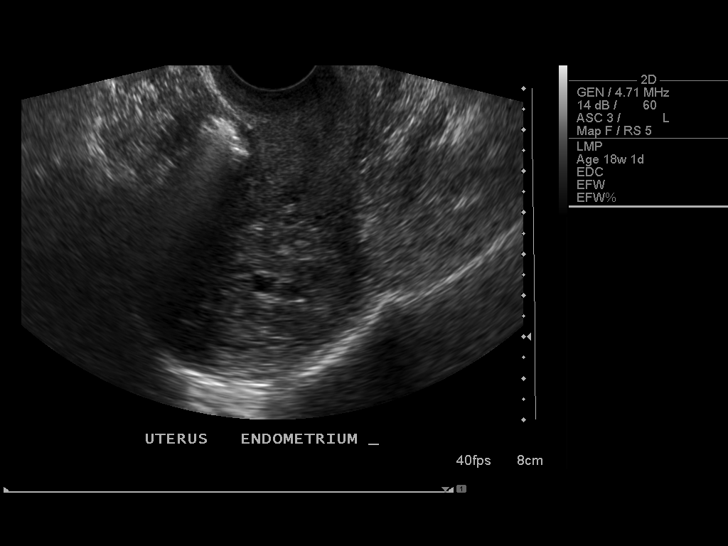
[im 32/54]
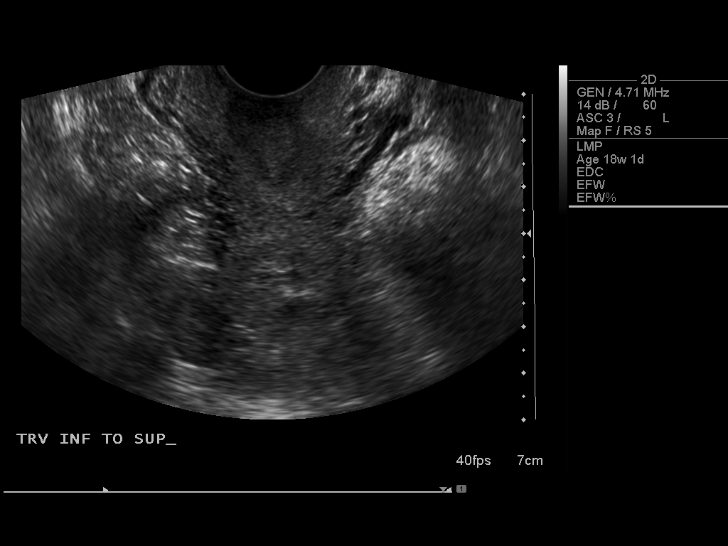
[im 36/54]
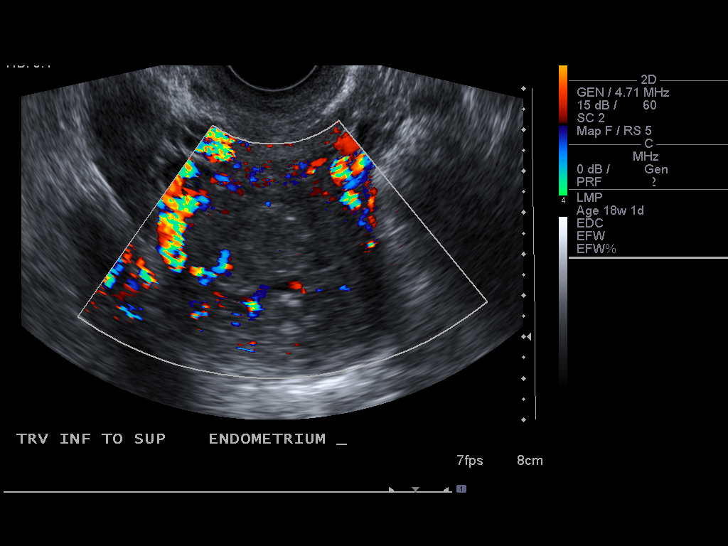
[im 40/54]
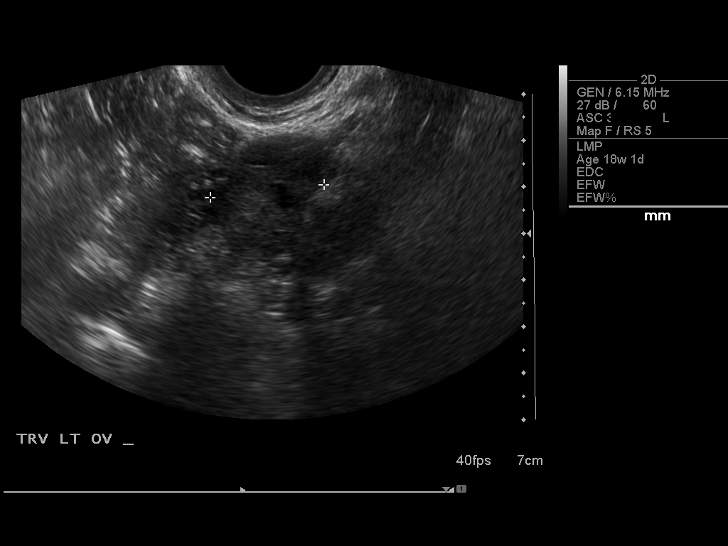
[im 44/54]
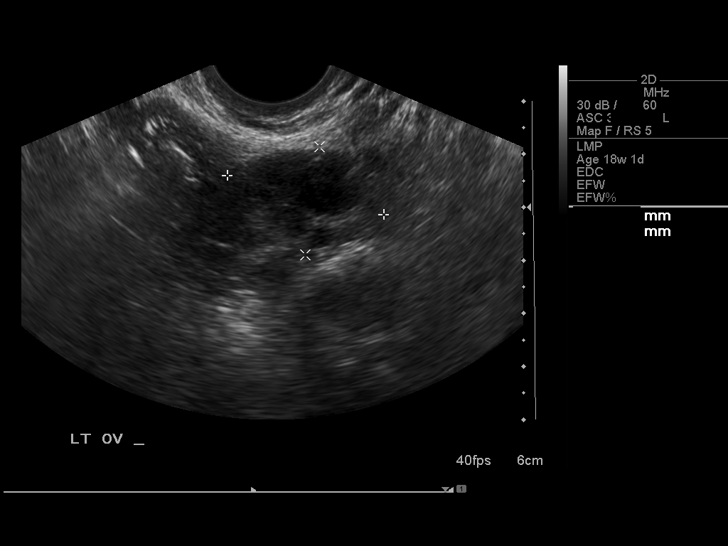
[im 48/54]
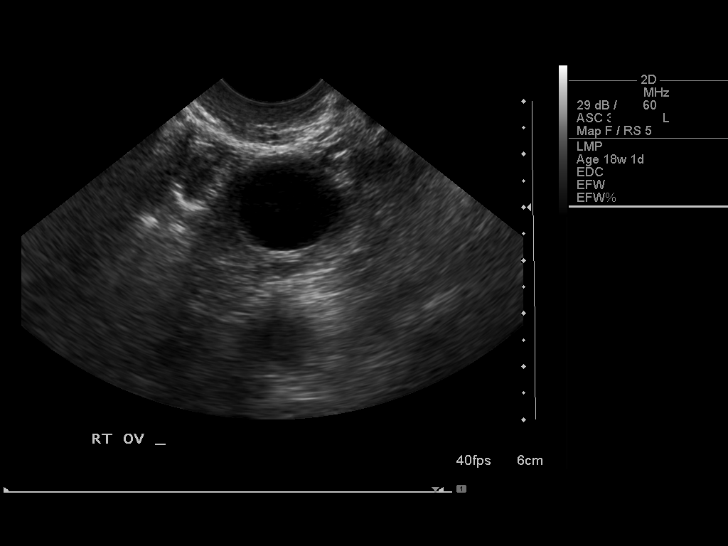
[im 52/54]
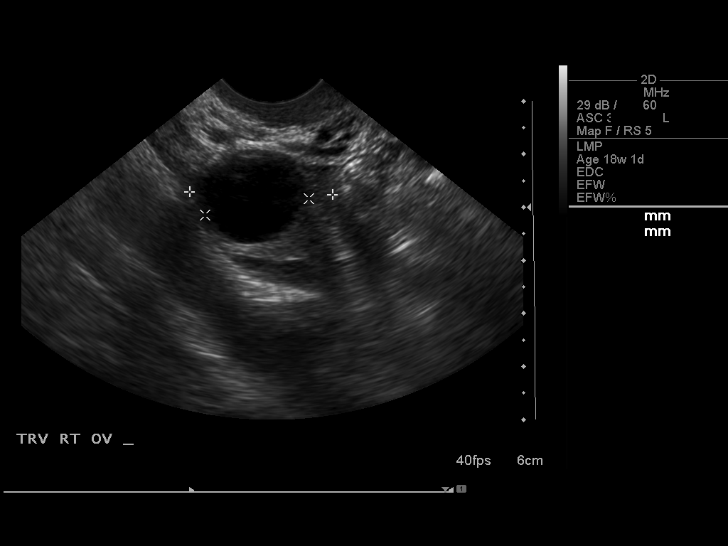

[13 of 28 positions shown; findings below may reference images not displayed]

Intrauterine gestational sac:  Not visualized.
Yolk sac: Not visualized.
Embryo: Not visualized.
Cardiac Activity: Not visualized.

Maternal uterus/adnexae:
There is a rounded, heterogeneous mass within the fundal portion of
the uterus, centrally.  This has mildly prominent internal blood
flow with color Doppler and measures 4.9 x 3.5 x 2.8 cm in maximum
dimensions.  The fundal endometrium is not visualized separate from
this mass.

A 2.0 cm simple cyst is noted in the right ovary.  The left ovary
is unremarkable.  No free peritoneal fluid is seen.
IMPRESSION: 1.  Heterogeneous mass in the fundal portion of the uterus in the
expected position of the endometrium.  This has prominent internal
blood flow with color Doppler and is concerning for the possibility
of a molar pregnancy.  A uterine fibroid is less likely.
2.  2.0 cm simple appearing right ovarian cyst.

## 2013-06-19 IMAGING — US US OB TRANSVAGINAL
1 series · 14 of 28 positions shown · non-contrast
Comparison: 03/11/2012

CLINICAL DATA: Bleeding, evaluate for retained products of
conception.

TRANSVAGINAL OB ULTRASOUND
TECHNIQUE: Transvaginal ultrasound was performed for evaluation of
the gestation as well as the maternal uterus and adnexal regions.

[Series 1: us ob transvaginal · 35 acquisitions, 14 frames shown]
[im 2/35]
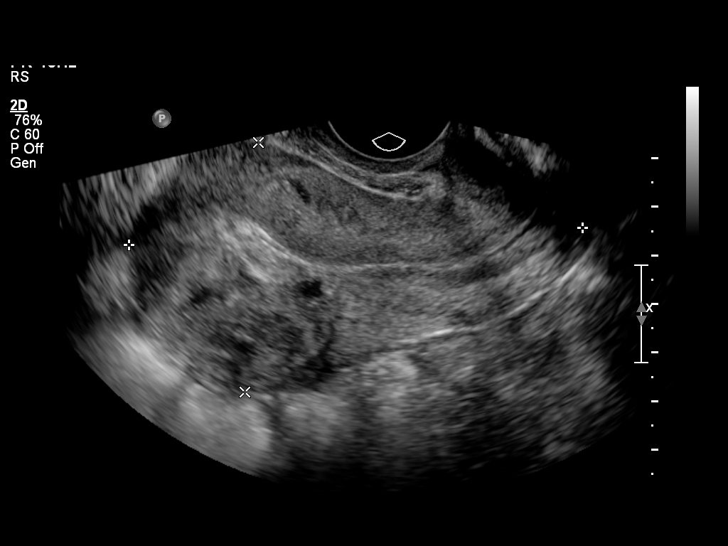
[im 4/35]
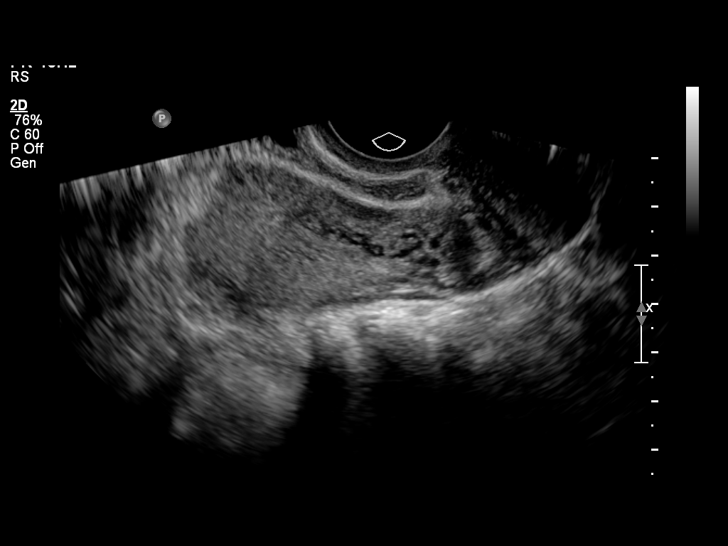
[im 7/35]
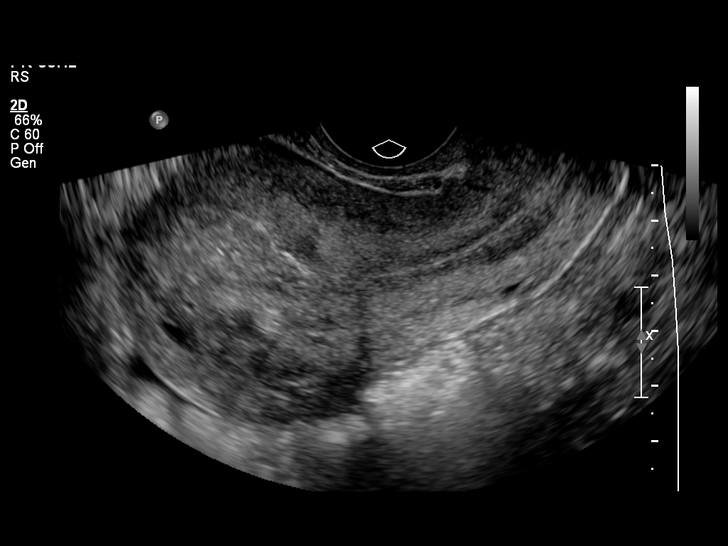
[im 9/35]
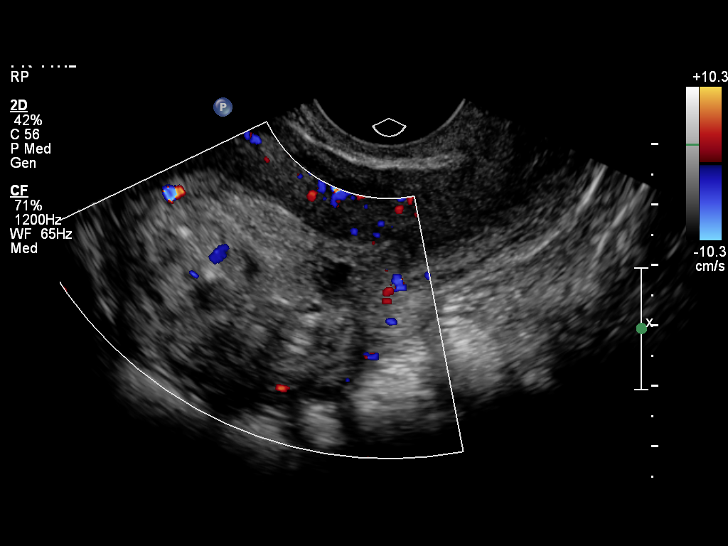
[im 12/35]
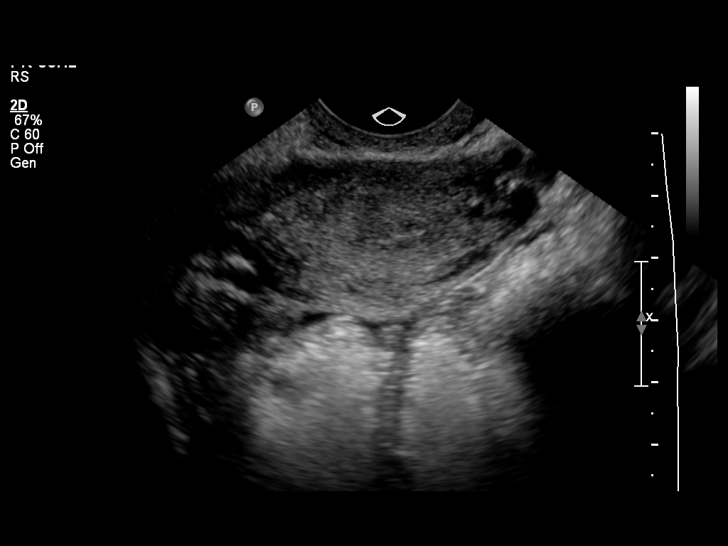
[im 14/35]
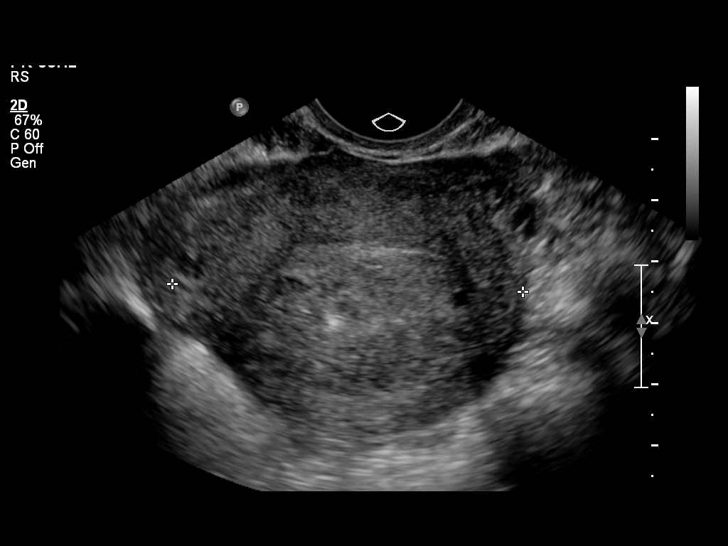
[im 17/35]
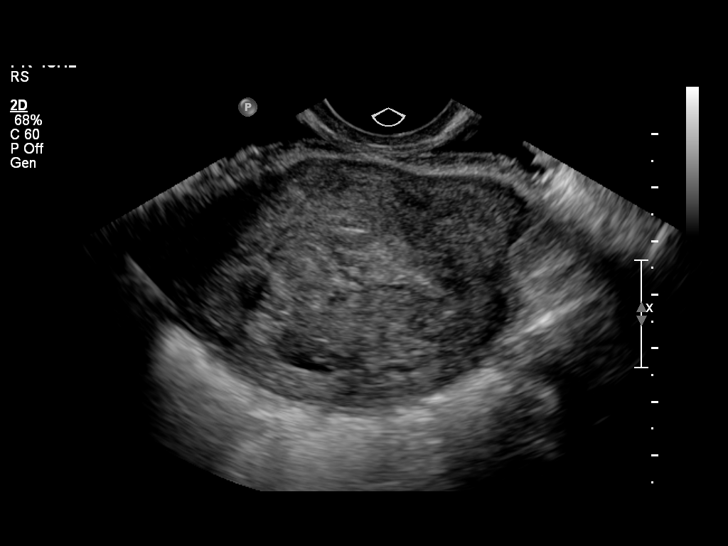
[im 19/35]
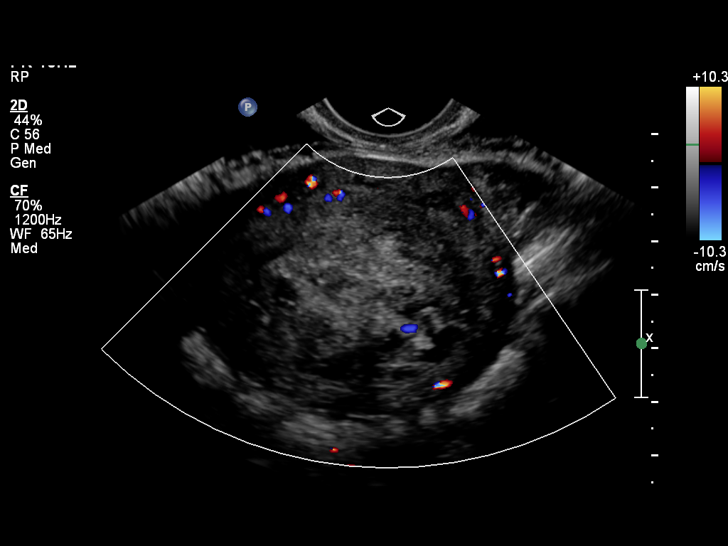
[im 22/35]
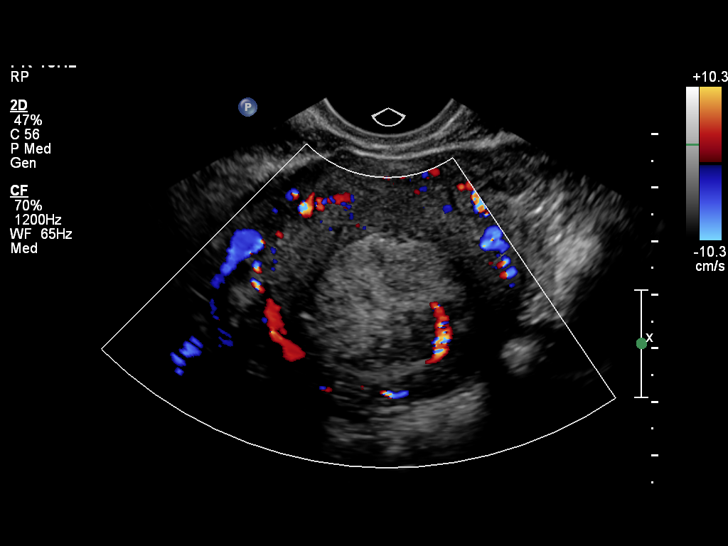
[im 24/35]
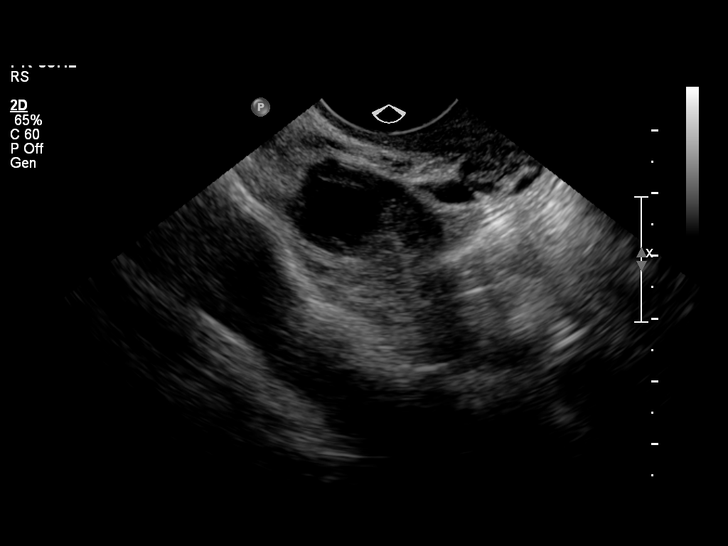
[im 27/35]
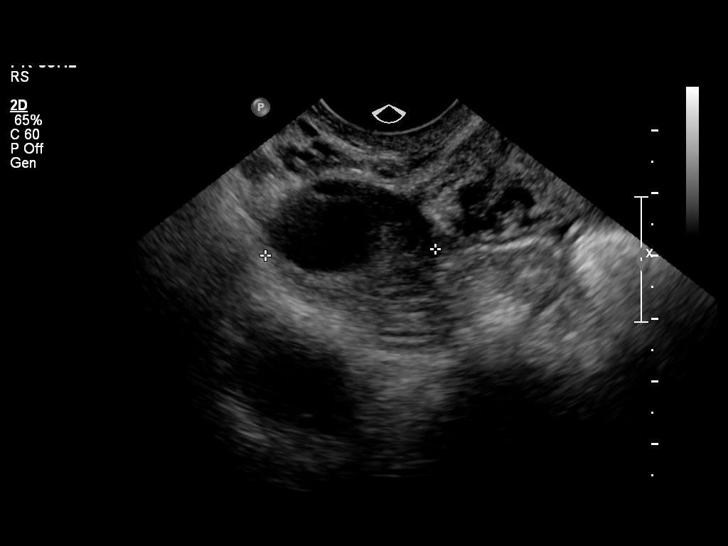
[im 29/35]
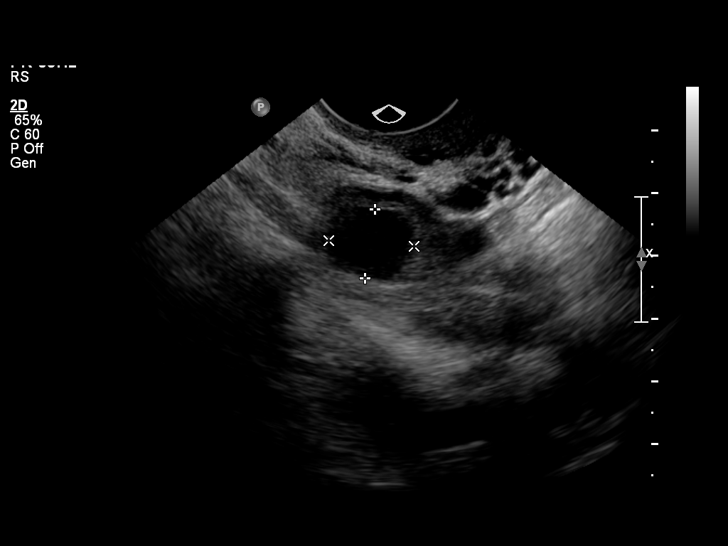
[im 32/35]
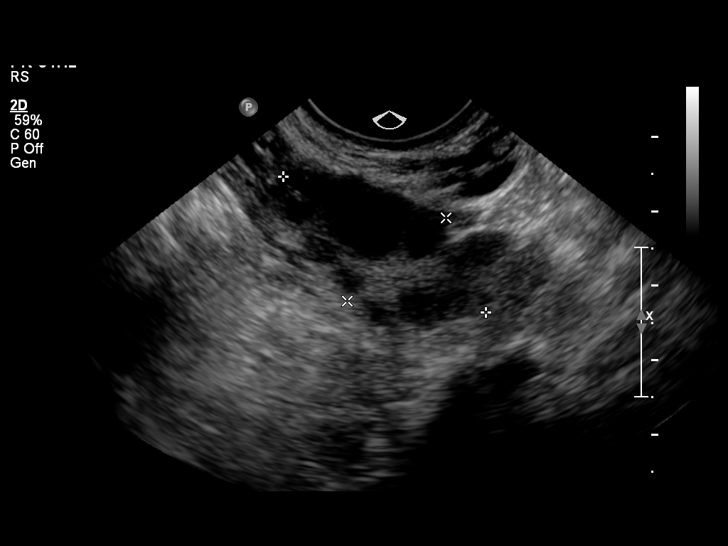
[im 35/35]
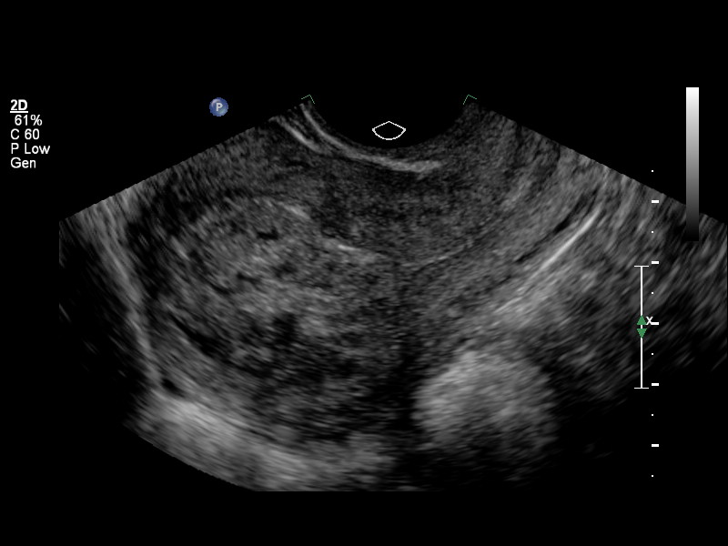

[14 of 28 positions shown; findings below may reference images not displayed]

FINDINGS: No intrauterine gestational sac, yolk sac, or embryo
identified.  Uterus measures 9.3 x 5.1 x 5.7 cm.  There is a 3.4 x
3.6 x 5.7 cm area of heterogeneous echogenicity and increased
vascularity along the uterine fundus, inseparable from the
endometrium.  Differential includes molar pregnancy, retained
products of conception, or a submucosal fibroid.

Normal sonographic appearance to the ovaries.  No free fluid.
IMPRESSION: 3.4 x 3.6 x 5.7 cm area of heterogeneous echogenicity and
increased vascularity along the uterine fundus, inseparable from
the endometrium.  Differential includes molar pregnancy, retained
products of conception, or a submucosal fibroid.

## 2014-06-18 ENCOUNTER — Other Ambulatory Visit: Payer: Self-pay | Admitting: Obstetrics and Gynecology

## 2014-06-20 LAB — CYTOLOGY - PAP

## 2014-07-07 ENCOUNTER — Encounter (HOSPITAL_COMMUNITY): Payer: Self-pay

## 2014-10-08 ENCOUNTER — Ambulatory Visit (INDEPENDENT_AMBULATORY_CARE_PROVIDER_SITE_OTHER): Payer: Medicare Other | Admitting: Family Medicine

## 2014-10-08 ENCOUNTER — Encounter: Payer: Self-pay | Admitting: Family Medicine

## 2014-10-08 VITALS — BP 110/74 | HR 53 | Temp 98.5°F | Resp 16 | Ht 67.25 in | Wt 150.0 lb

## 2014-10-08 DIAGNOSIS — M222X1 Patellofemoral disorders, right knee: Secondary | ICD-10-CM

## 2014-10-08 NOTE — Progress Notes (Signed)
Pre visit review using our clinic review tool, if applicable. No additional management support is needed unless otherwise documented below in the visit note. 

## 2014-10-08 NOTE — Progress Notes (Signed)
Office Note 10/08/2014  CC:  Chief Complaint  Patient presents with  . Establish Care    never had PCP  . Referral    PT for knee problem.    HPI:  Courtney Tyler is a 29 y.o. White female who is here to establish care. Patient's most recent primary MD: none Old records in EPIC/[facility] EMR were reviewed prior to or during today's visit.  Training for marathon, has right knee pain intermittently over knee cap area, no swelling.  Some days she runs 10 miles and doesn't feel anything, other days she feels it at 5-6 miles.  Similar problem with a slight pinch sensation in lateral aspect of left LL about 1/2 way up--lasts only a few seconds and then goes away.  These problems seem to come more when running on treadmill and on inclines as opposed to outside running.  Past Medical History  Diagnosis Date  . Abnormal Pap smear 2006    colpo & LEEP  . Hx of varicella   . Depression     mild pp dep no meds    Past Surgical History  Procedure Laterality Date  . Vaginal delivery  07/16/10  . Dilation and evacuation  01/23/2012    Procedure: DILATATION AND EVACUATION;  Surgeon: Zelphia Cairo, MD;  Location: [facility];  Service: Gynecology;  Laterality: N/A;  . Leep    . Dilation and evacuation  03/17/2012    Procedure: DILATATION AND EVACUATION;  Surgeon: Zelphia Cairo, MD;  Location: [facility];  Service: Gynecology;  Laterality: N/A;    Family History  Problem Relation Age of Onset  . Migraines Mother   . Asthma Mother   . Cancer Maternal Grandfather     lung    History   Social History  . Marital Status: Married    Spouse Name: N/A    Number of Children: N/A  . Years of Education: N/A   Occupational History  . Not on file.   Social History Main Topics  . Smoking status: Never Smoker   . Smokeless tobacco: Never Used  . Alcohol Use: Yes     Comment: socially  . Drug Use: No  . Sexual Activity: Yes    Birth Control/ Protection: IUD   Other Topics Concern  . Not on  file   Social History Narrative    Outpatient Encounter Prescriptions as of 10/08/2014  Medication Sig  . levonorgestrel (MIRENA) 20 MCG/24HR IUD 1 each by Intrauterine route once.  . [DISCONTINUED] benzocaine-Menthol (DERMOPLAST) 20-0.5 % AERO Apply 1 application topically as needed (perineal discomfort).  . [DISCONTINUED] ibuprofen (ADVIL,MOTRIN) 600 MG tablet Take 1 tablet (600 mg total) by mouth every 6 (six) hours as needed for pain.  . [DISCONTINUED] oxyCODONE-acetaminophen (PERCOCET/ROXICET) 5-325 MG per tablet Take 1-2 tablets by mouth every 6 (six) hours as needed.  . [DISCONTINUED] Prenatal Vit-Fe Fumarate-FA (PRENATAL MULTIVITAMIN) TABS Take 1 tablet by mouth daily.    No Known Allergies  ROS Review of Systems  Constitutional: Negative for fever and fatigue.  HENT: Negative for congestion and sore throat.   Eyes: Negative for visual disturbance.  Respiratory: Negative for cough.   Cardiovascular: Negative for chest pain.  Gastrointestinal: Negative for nausea and abdominal pain.  Genitourinary: Negative for dysuria.  Musculoskeletal: Negative for back pain and joint swelling.  Skin: Negative for rash.  Neurological: Negative for weakness and headaches.  Hematological: Negative for adenopathy.    PE; Blood pressure 110/74, pulse 53, temperature 98.5 F (36.9 C), temperature  source Temporal, resp. rate 16, height 5' 7.25" (1.708 m), weight 150 lb (68.04 kg), SpO2 98 %, not currently breastfeeding.  Pt examined with Faythe GheeLisa Albright, CMA, as chaperone.  Gen: Alert, well appearing.  Patient is oriented to person, place, time, and situation. ZOX:WRUEENT:Eyes: no injection, icteris, swelling, or exudate.  EOMI, PERRLA. Mouth: lips without lesion/swelling.  Oral mucosa pink and moist. Oropharynx without erythema, exudate, or swelling.  Neck - No masses or thyromegaly or limitation in range of motion CV: RRR, no m/r/g.   LUNGS: CTA bilat, nonlabored resps, good aeration in all lung  fields. Knees: no swelling, no tenderness, no ROM limitations.  No instability. Patellar grind neg.  Just eyeballing her Q angle it appears to be increased. Left calf without any tenderness to squeezing anywhere.  NO edema.     Pertinent labs:  none  ASSESSMENT AND PLAN:   New Pt; no old records to obtain.  1) Pt training for marathon and seems to have some normal intermittent musculoskeletal pains as a result. However, she does have intermittent patellofemoral pain that is exacerbated by her running but only sometimes. I think her running form may need some attention and this may help prevent/alleviate any lower extremity issues that may want to arise when she is training for her marathon in the near future.  Will refer to Dr. Terrilee FilesZach Smith in Sports medicine for further expertise in these areas.  An After Visit Summary was printed and given to the patient.  F/u prn.

## 2014-10-22 ENCOUNTER — Encounter: Payer: Self-pay | Admitting: Family Medicine

## 2014-10-22 ENCOUNTER — Ambulatory Visit (INDEPENDENT_AMBULATORY_CARE_PROVIDER_SITE_OTHER): Payer: 59 | Admitting: Family Medicine

## 2014-10-22 ENCOUNTER — Other Ambulatory Visit (INDEPENDENT_AMBULATORY_CARE_PROVIDER_SITE_OTHER): Payer: 59

## 2014-10-22 VITALS — BP 104/70 | HR 60 | Ht 67.5 in | Wt 147.0 lb

## 2014-10-22 DIAGNOSIS — M25561 Pain in right knee: Secondary | ICD-10-CM

## 2014-10-22 DIAGNOSIS — M7631 Iliotibial band syndrome, right leg: Secondary | ICD-10-CM | POA: Insufficient documentation

## 2014-10-22 DIAGNOSIS — M7632 Iliotibial band syndrome, left leg: Secondary | ICD-10-CM

## 2014-10-22 NOTE — Assessment & Plan Note (Signed)
Bilateral.  Secondary to patient's hip abductor weakness. Patient did work with a thigh trying today to learn home exercises. We discussed icing regimen, over-the-counter medications as well as nutritional changes that I think will be beneficial. We discussed possible running mechanics and patient may come back for another gait analysis at later date. Patient though will try these different changes and come back and see me again in 3-4 weeks for further evaluation and treatment.

## 2014-10-22 NOTE — Patient Instructions (Addendum)
Good to meet you Exercises 3 times a week.  Look up omega 3 and omega 6 foods.  You want a diet high in omega 3s and low in omega 6 4:1 ration of carbs to protein after working out.  Within 30-45 minutes for sure Body helix.com size medium knee compression could help Exercises on wall.  Heel and butt touching.  Raise leg 6 inches and hold 2 seconds.  Down slow for count of 4 seconds.  1 set of 30 reps daily on both sides.  Vitamin D 2000 IU daily Fish oil 2 grams daily Consider iron 325mg  3 times a week to help as well.  See me again in 3-4 weeks.

## 2014-10-22 NOTE — Progress Notes (Signed)
Tawana ScaleZach Smith D.O. [facility] [address] [place], [place] [address] Phone: [phone] Subjective:    I'm seeing this patient by the request  of:  MCGOWEN,PHILIP H, MD   CC: Bilateral knee discomfort right greater than left  ZHY:QMVHQIONGEHPI:Subjective Courtney LacyJennifer N Tyler is a 29 y.o. female coming in with complaint of complaining of bilateral knee discomfort. Patient states that the right knee is worse than the left knee. Patient is a individual who does do a significant amount of working out. Patient is starting to train for marathon. States that when she does run especially when she is going downhill or starts to decrease her acceleration she has some discomfort on the lateral aspects of the knees bilaterally. The longer she runs the more discomfort she has is well. Denies any numbness or tingling. Denies any foot drop or any falling. Patient states that this can be more intermittent. Has not notice any other association and what has been described above. Patient denies any recent changes in nutrition or with her shoes. Patient rates the severity of pain is 4 out of 10. This is not stopping her from activity but with her increasing her activity she would like to avoid any type of injury.     Past medical history, social, surgical and family history all reviewed in electronic medical record.   Review of Systems: No headache, visual changes, nausea, vomiting, diarrhea, constipation, dizziness, abdominal pain, skin rash, fevers, chills, night sweats, weight loss, swollen lymph nodes, body aches, joint swelling, muscle aches, chest pain, shortness of breath, mood changes.   Objective Blood pressure 104/70, pulse 60, height 5' 7.5" (1.715 m), weight 147 lb (66.679 kg), SpO2 98 %, not currently breastfeeding.  General: No apparent distress alert and oriented x3 mood and affect normal, dressed appropriately.  HEENT: Pupils equal, extraocular movements intact  Respiratory: Patient's speak in full  sentences and does not appear short of breath  Cardiovascular: No lower extremity edema, non tender, no erythema  Skin: Warm dry intact with no signs of infection or rash on extremities or on axial skeleton.  Abdomen: Soft nontender  Neuro: Cranial nerves II through XII are intact, neurovascularly intact in all extremities with 2+ DTRs and 2+ pulses.  Lymph: No lymphadenopathy of posterior or anterior cervical chain or axillae bilaterally.  Gait normal with good balance and coordination.  MSK:  Non tender with full range of motion and good stability and symmetric strength and tone of shoulders, elbows, wrist, hip, and ankles bilaterally.  Knee: Bilateral Normal to inspection with no erythema or effusion or obvious bony abnormalities. Palpation normal with no warmth, joint line tenderness, patellar tenderness, or condyle tenderness. Patient is moderately tender to palpation over the distal iliotibial band that bilaterally ROM full in flexion and extension and lower leg rotation. Ligaments with solid consistent endpoints including ACL, PCL, LCL, MCL. Negative Mcmurray's, Apley's, and Thessalonian tests. Non painful patellar compression. Patellar glide without crepitus. Patellar and quadriceps tendons unremarkable. Hamstring and quadriceps strength is normal.  Significant weakness of the hip abductors bilaterally  MSK US performed of: Right This study was ordered, performed, and interpreted by Terrilee FilesZach Smith D.O.  Knee: All structures visualized. Anteromedial, anterolateral, posteromedial, and posterolateral menisci unremarkable without tearing, fraying, effusion, or displacement. Patellar Tendon unremarkable on long and transverse views without effusion. No abnormality of prepatellar bursa. LCL and MCL unremarkable on long and transverse views. No abnormality of origin of medial or lateral head of the gastrocnemius.  IMPRESSION:  Normal  Procedure note 97110; 15 minutes spent for  Therapeutic exercises as stated in above notes.  This included exercises focusing on stretching, strengthening, with significant focus on eccentric aspects.   Proper technique shown and discussed handout in great detail with ATC.  All questions were discussed and answered.     Impression and Recommendations:     This case required medical decision making of moderate complexity.

## 2015-12-02 ENCOUNTER — Encounter: Payer: Self-pay | Admitting: Sports Medicine

## 2015-12-02 ENCOUNTER — Ambulatory Visit (INDEPENDENT_AMBULATORY_CARE_PROVIDER_SITE_OTHER): Payer: BLUE CROSS/BLUE SHIELD | Admitting: Sports Medicine

## 2015-12-02 VITALS — BP 109/67 | Ht 68.0 in | Wt 145.0 lb

## 2015-12-02 DIAGNOSIS — S76311A Strain of muscle, fascia and tendon of the posterior muscle group at thigh level, right thigh, initial encounter: Secondary | ICD-10-CM | POA: Diagnosis not present

## 2015-12-02 MED ORDER — NITROGLYCERIN 0.2 MG/HR TD PT24: MEDICATED_PATCH | TRANSDERMAL | Status: DC

## 2015-12-02 NOTE — Patient Instructions (Signed)

## 2015-12-02 NOTE — Progress Notes (Signed)
Courtney Tyler - 30 y.o. female MRN [id number]  Date of birth: Feb 06, 1986  CC: Right hamstring pain  SUBJECTIVE:   HPI  This is Courtney Tyler is a very pleasant 30 year old female who presents with 3 weeks of right hamstring discomfort. She initially injured herself while on an 18 mile training run 8 miles into the run. She states that she did not warm up and it was very cold outside. She was able to continue running and finish the final 8 miles.  She does know that she has hamstring tightness and doesn't stretch very often. She was doing speed work through her training program.. During the final 2-1/2 weeks of her training she is limited to 6 miles. She was seen by Dr. Lendon Collarodolfo who did some dry needling and deep massage as well as taping. She did attempt to participate in her marathon this past weekend but felt a pop at mile for and was unable to continue. She denies any significant bruising and is now able to walk with relatively little discomfort. His not yet able to run. She has tried taking some ibuprofen which she no longer needs. She does teach bar and spin classes. She is otherwise doing well and eager to sign up for another marathon.  She wears a brooks shoe with a 12 mm drop.  ROS:     As above, no fevers chills or night sweats. No new rashes or bruising. No joint pain or swelling.  HISTORY: Past Medical, Surgical, Social, and Family History Reviewed & Updated per EMR.  Pertinent Historical Findings include: none  OBJECTIVE: BP 109/67 mmHg  Ht 5\' 8"  (1.727 m)  Wt 145 lb (65.772 kg)  BMI 22.05 kg/m2  Physical Exam  Calm, no acute distress Nonlabored breathing  No bruising identified. No muscle defect. Visually there is swelling within the mid body of the right hamstring. Courtney Tyler is tender to palpation in the mid belly of the right hamstring just proximal to the biceps femoris and semi-tendinosis division. Nontender over the distal biceps femoris and semi-tendinosis. Nontender over the  hamstring origin. Patient does have pain and weakness with resisted knee flexion at 30 flexion. She has no pain or weakness with knee flexion at 90 flexion  Ultrasound: Long and short axis views were taken of the right hamstring. There is a split extending for 3-4 cm best seen on the long axis just lateral to the hamstring septum. This extends from 3-5 cm deep. No increase blood flow identified today.  MEDICATIONS, LABS & OTHER ORDERS: Previous Medications   LEVONORGESTREL (MIRENA) 20 MCG/24HR IUD    1 each by Intrauterine route once.   Modified Medications   No medications on file   New Prescriptions   NITROGLYCERIN (NITRODUR - DOSED IN MG/24 HR) 0.2 MG/HR PATCH    Place 1/4 patch on affected area daily   Discontinued Medications   No medications on file  No orders of the defined types were placed in this encounter.   ASSESSMENT & PLAN: Grade 1-2 hamstring strain, right:We have provided her with the below rehabilitation protocol in addition to up-to-date's extensive hamstring rehabilitation program. She should refrain from running for the next 4 weeks until she follows up. We provided her with the body helix website and encouraged her to buy a thigh compression sleeve. Additionally we have provided her with nitroglycerin patches to be used daily. She was counseled on the possible side effects. 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 Please call with any questions and will see back in 4 weeks.  Hamstring curls: Start with 3 sets of 15 (no weight); Progress by 5 reps every 3 days until you reach 3 sets of 30; After 3 days at 3 sets of 30, add 2lb ankle weight at 3 sets of 10; Increase every 5 days by 5 reps. You may add 2lbs ankle weight once weekly. Hamstring swings- swing leg backwards and  curl at the end of the swing. Follow same schedule as above. Hamstring running lunges- running lunge position means no more than 45 degrees of knee flexion and running motion. Follow same schedule as above.

## 2015-12-04 ENCOUNTER — Ambulatory Visit: Payer: Self-pay | Admitting: Family

## 2016-01-06 ENCOUNTER — Ambulatory Visit (INDEPENDENT_AMBULATORY_CARE_PROVIDER_SITE_OTHER): Payer: BLUE CROSS/BLUE SHIELD | Admitting: Family Medicine

## 2016-01-06 ENCOUNTER — Encounter: Payer: Self-pay | Admitting: Family Medicine

## 2016-01-06 VITALS — BP 112/54 | HR 61 | Ht 68.0 in | Wt 145.0 lb

## 2016-01-06 DIAGNOSIS — S76311D Strain of muscle, fascia and tendon of the posterior muscle group at thigh level, right thigh, subsequent encounter: Secondary | ICD-10-CM | POA: Diagnosis not present

## 2016-01-08 ENCOUNTER — Encounter: Payer: Self-pay | Admitting: Family Medicine

## 2016-01-08 NOTE — Progress Notes (Signed)
  Courtney Tyler - 30 y.o. female MRN [id number]  Date of birth: 1986/01/10  CC: Right hamstring pain  SUBJECTIVE:   HPI  This is Courtney MorganDisney is a very pleasant 30 year old female who presents for f/u of a right grade II hamstring strain 2 months ago.  - She initially injured herself while on an 18 mile training run 8 miles into the run. She states that she did not warm up and it was very cold outside. She was able to continue running and finish the final 8 miles.   - She does know that she has hamstring tightness and doesn't stretch very often.  - She previously was seen by Dr. Lendon Collarodolfo who did some dry needling and deep massage as well as taping.  - She did attempt to participate in her marathon but felt a pop at mile 4 and was unable to continue.   She is here today in follow up: - She wears a brooks shoe with a 12 mm drop. - Has not been back running just yet.  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  - Wants to run a marathon .  - Some pain with mountain climbers in [employer] class. Usually just a twinge of pain, but nothing consistent.  - No pain medications.  - She is onto phase 2 of the hamstring rehab program.  - Using ntg patches daily.   ROS:     As above, no fevers chills or night sweats. No new rashes or bruising. No joint pain or swelling.  HISTORY: Past Medical, Surgical, Social, and Family History Reviewed & Updated per EMR.  Pertinent Historical Findings include: none  OBJECTIVE: BP 112/54 mmHg  Pulse 61  Ht 5\' 8"  (1.727 m)  Wt 145 lb (65.772 kg)  BMI 22.05 kg/m2  Physical Exam  Calm, no acute distress Nonlabored breathing  No bruising identified. No palpablemuscle defect. No swelling or tenderness. No pain with resisted knee flexion at 30 or 90 degrees.   Ultrasound: none today.   MEDICATIONS, LABS & OTHER ORDERS: Previous Medications   LEVONORGESTREL (MIRENA) 20 MCG/24HR IUD    1 each by Intrauterine route once.   NITROGLYCERIN (NITRODUR - DOSED IN MG/24 HR) 0.2 MG/HR  PATCH    Place 1/4 patch on affected area daily   Modified Medications   No medications on file   New Prescriptions   No medications on file   Discontinued Medications   No medications on file  No orders of the defined types were placed in this encounter.   ASSESSMENT & PLAN: Grade 2 hamstring strain, right: Doing well with rehab, completed phase 2 of UTD rehab protocol.  - Progress to phase 3 which includes light jogging. Over the next month return to running.  - Minimize down hill work and speed work at first.  - Continue compression sleeve during workouts.  - May limit explosive exercises such as mountain climbers.  .- Please call with any questions.  - Provided with uptodate hamstring prevention program to transition to after competing phase 3.  - f/u prn.

## 2017-04-20 LAB — OB RESULTS CONSOLE HEPATITIS B SURFACE ANTIGEN: HEP B S AG: NEGATIVE

## 2017-04-20 LAB — OB RESULTS CONSOLE GC/CHLAMYDIA
Chlamydia: NEGATIVE
Gonorrhea: NEGATIVE

## 2017-04-20 LAB — OB RESULTS CONSOLE ANTIBODY SCREEN: Antibody Screen: NEGATIVE

## 2017-04-20 LAB — OB RESULTS CONSOLE RUBELLA ANTIBODY, IGM: RUBELLA: IMMUNE

## 2017-04-20 LAB — OB RESULTS CONSOLE HIV ANTIBODY (ROUTINE TESTING): HIV: NONREACTIVE

## 2017-04-20 LAB — OB RESULTS CONSOLE ABO/RH: RH TYPE: POSITIVE

## 2017-04-20 LAB — OB RESULTS CONSOLE RPR: RPR: NONREACTIVE

## 2017-09-01 DIAGNOSIS — Z23 Encounter for immunization: Secondary | ICD-10-CM | POA: Diagnosis not present

## 2017-09-05 NOTE — L&D Delivery Note (Signed)
Delivery Note  SVD viable female Apgars 9,9 over 1st ml lac.  Placenta delivered spontaneously intact with 3VC. Repair with 2-0 Chromic with good support and hemostasis noted.  R/V exam confirms.  PH art was sent.   Mother and baby to couplet care and are doing well.  EBL 200cc  Candice Campavid Kayce Chismar, MD

## 2017-09-14 DIAGNOSIS — Z3A30 30 weeks gestation of pregnancy: Secondary | ICD-10-CM | POA: Diagnosis not present

## 2017-09-14 DIAGNOSIS — Z3A28 28 weeks gestation of pregnancy: Secondary | ICD-10-CM | POA: Diagnosis not present

## 2017-09-14 DIAGNOSIS — Z3A38 38 weeks gestation of pregnancy: Secondary | ICD-10-CM | POA: Diagnosis not present

## 2017-09-14 DIAGNOSIS — Z3483 Encounter for supervision of other normal pregnancy, third trimester: Secondary | ICD-10-CM | POA: Diagnosis not present

## 2017-09-20 DIAGNOSIS — J069 Acute upper respiratory infection, unspecified: Secondary | ICD-10-CM | POA: Diagnosis not present

## 2017-09-27 DIAGNOSIS — Z3A28 28 weeks gestation of pregnancy: Secondary | ICD-10-CM | POA: Diagnosis not present

## 2017-09-27 DIAGNOSIS — Z3483 Encounter for supervision of other normal pregnancy, third trimester: Secondary | ICD-10-CM | POA: Diagnosis not present

## 2017-09-27 DIAGNOSIS — Z3A30 30 weeks gestation of pregnancy: Secondary | ICD-10-CM | POA: Diagnosis not present

## 2017-10-11 DIAGNOSIS — Z3483 Encounter for supervision of other normal pregnancy, third trimester: Secondary | ICD-10-CM | POA: Diagnosis not present

## 2017-10-11 DIAGNOSIS — Z3A28 28 weeks gestation of pregnancy: Secondary | ICD-10-CM | POA: Diagnosis not present

## 2017-10-11 DIAGNOSIS — Z3A30 30 weeks gestation of pregnancy: Secondary | ICD-10-CM | POA: Diagnosis not present

## 2017-10-26 DIAGNOSIS — Z3483 Encounter for supervision of other normal pregnancy, third trimester: Secondary | ICD-10-CM | POA: Diagnosis not present

## 2017-10-26 DIAGNOSIS — Z3A28 28 weeks gestation of pregnancy: Secondary | ICD-10-CM | POA: Diagnosis not present

## 2017-10-26 DIAGNOSIS — Z3A30 30 weeks gestation of pregnancy: Secondary | ICD-10-CM | POA: Diagnosis not present

## 2017-11-06 DIAGNOSIS — Z3A30 30 weeks gestation of pregnancy: Secondary | ICD-10-CM | POA: Diagnosis not present

## 2017-11-06 DIAGNOSIS — Z3483 Encounter for supervision of other normal pregnancy, third trimester: Secondary | ICD-10-CM | POA: Diagnosis not present

## 2017-11-06 DIAGNOSIS — Z3A28 28 weeks gestation of pregnancy: Secondary | ICD-10-CM | POA: Diagnosis not present

## 2017-11-13 DIAGNOSIS — Z3483 Encounter for supervision of other normal pregnancy, third trimester: Secondary | ICD-10-CM | POA: Diagnosis not present

## 2017-11-13 DIAGNOSIS — Z3A28 28 weeks gestation of pregnancy: Secondary | ICD-10-CM | POA: Diagnosis not present

## 2017-11-13 DIAGNOSIS — Z3A30 30 weeks gestation of pregnancy: Secondary | ICD-10-CM | POA: Diagnosis not present

## 2017-11-15 ENCOUNTER — Telehealth (HOSPITAL_COMMUNITY): Payer: Self-pay | Admitting: *Deleted

## 2017-11-15 ENCOUNTER — Encounter (HOSPITAL_COMMUNITY): Payer: Self-pay | Admitting: *Deleted

## 2017-11-15 NOTE — Telephone Encounter (Signed)
Preadmission screen  

## 2017-11-21 DIAGNOSIS — Z3A28 28 weeks gestation of pregnancy: Secondary | ICD-10-CM | POA: Diagnosis not present

## 2017-11-21 DIAGNOSIS — Z3483 Encounter for supervision of other normal pregnancy, third trimester: Secondary | ICD-10-CM | POA: Diagnosis not present

## 2017-11-21 DIAGNOSIS — Z3A38 38 weeks gestation of pregnancy: Secondary | ICD-10-CM | POA: Diagnosis not present

## 2017-11-26 MED ORDER — ZOLPIDEM TARTRATE 5 MG PO TABS
5.0000 mg | ORAL_TABLET | Freq: Every evening | ORAL | Status: DC | PRN
  Administered 2017-11-27: 5 mg via ORAL
  Filled 2017-11-26: qty 1

## 2017-11-27 ENCOUNTER — Inpatient Hospital Stay (HOSPITAL_COMMUNITY): Payer: 59 | Admitting: Anesthesiology

## 2017-11-27 ENCOUNTER — Inpatient Hospital Stay (HOSPITAL_COMMUNITY)
Admission: RE | Admit: 2017-11-27 | Discharge: 2017-11-28 | DRG: 807 | Disposition: A | Discharge status: Home / Self Care | Payer: 59 | Source: Ambulatory Visit | Attending: Obstetrics and Gynecology | Admitting: Obstetrics and Gynecology

## 2017-11-27 ENCOUNTER — Encounter (HOSPITAL_COMMUNITY): Payer: Self-pay

## 2017-11-27 VITALS — BP 101/58 | HR 70 | Temp 97.9°F | Resp 16 | Ht 68.0 in | Wt 189.0 lb

## 2017-11-27 DIAGNOSIS — O26893 Other specified pregnancy related conditions, third trimester: Secondary | ICD-10-CM | POA: Diagnosis not present

## 2017-11-27 DIAGNOSIS — Z3A39 39 weeks gestation of pregnancy: Secondary | ICD-10-CM

## 2017-11-27 DIAGNOSIS — Z3490 Encounter for supervision of normal pregnancy, unspecified, unspecified trimester: Secondary | ICD-10-CM

## 2017-11-27 LAB — TYPE AND SCREEN
ABO/RH(D): O POS
Antibody Screen: NEGATIVE

## 2017-11-27 LAB — CBC
HEMATOCRIT: 36 % (ref 36.0–46.0)
Hemoglobin: 12.4 g/dL (ref 12.0–15.0)
MCH: 31.1 pg (ref 26.0–34.0)
MCHC: 34.4 g/dL (ref 30.0–36.0)
MCV: 90.2 fL (ref 78.0–100.0)
Platelets: 206 10*3/uL (ref 150–400)
RBC: 3.99 MIL/uL (ref 3.87–5.11)
RDW: 12.7 % (ref 11.5–15.5)
WBC: 9.3 10*3/uL (ref 4.0–10.5)

## 2017-11-27 LAB — OB RESULTS CONSOLE GBS: GBS: NEGATIVE

## 2017-11-27 LAB — RPR: RPR Ser Ql: NONREACTIVE

## 2017-11-27 MED ORDER — WITCH HAZEL-GLYCERIN EX PADS: 1.0000 "application " | MEDICATED_PAD | CUTANEOUS | Status: DC | PRN

## 2017-11-27 MED ORDER — OXYTOCIN 40 UNITS IN LACTATED RINGERS INFUSION - SIMPLE MED
2.5000 [IU]/h | INTRAVENOUS | Status: DC
  Administered 2017-11-27: 2.5 [IU]/h via INTRAVENOUS
  Filled 2017-11-27: qty 1000

## 2017-11-27 MED ORDER — LACTATED RINGERS IV SOLN
INTRAVENOUS | Status: DC
  Administered 2017-11-27 (×2): via INTRAVENOUS

## 2017-11-27 MED ORDER — BENZOCAINE-MENTHOL 20-0.5 % EX AERO
1.0000 "application " | INHALATION_SPRAY | CUTANEOUS | Status: DC | PRN
  Administered 2017-11-27: 1 via TOPICAL
  Filled 2017-11-27: qty 56

## 2017-11-27 MED ORDER — EPHEDRINE 5 MG/ML INJ
10.0000 mg | INTRAVENOUS | Status: DC | PRN
  Filled 2017-11-27: qty 2

## 2017-11-27 MED ORDER — MEASLES, MUMPS & RUBELLA VAC ~~LOC~~ INJ: 0.5000 mL | INJECTION | Freq: Once | SUBCUTANEOUS | Status: DC

## 2017-11-27 MED ORDER — TETANUS-DIPHTH-ACELL PERTUSSIS 5-2.5-18.5 LF-MCG/0.5 IM SUSP: 0.5000 mL | Freq: Once | INTRAMUSCULAR | Status: DC

## 2017-11-27 MED ORDER — OXYTOCIN BOLUS FROM INFUSION
500.0000 mL | Freq: Once | INTRAVENOUS | Status: AC
  Administered 2017-11-27: 500 mL via INTRAVENOUS

## 2017-11-27 MED ORDER — PHENYLEPHRINE 40 MCG/ML (10ML) SYRINGE FOR IV PUSH (FOR BLOOD PRESSURE SUPPORT)
PREFILLED_SYRINGE | INTRAVENOUS | Status: AC
  Filled 2017-11-27: qty 10

## 2017-11-27 MED ORDER — ZOLPIDEM TARTRATE 5 MG PO TABS: 5.0000 mg | ORAL_TABLET | Freq: Every evening | ORAL | Status: DC | PRN

## 2017-11-27 MED ORDER — OXYCODONE-ACETAMINOPHEN 5-325 MG PO TABS: 1.0000 | ORAL_TABLET | ORAL | Status: DC | PRN

## 2017-11-27 MED ORDER — DIBUCAINE 1 % RE OINT: 1.0000 "application " | TOPICAL_OINTMENT | RECTAL | Status: DC | PRN

## 2017-11-27 MED ORDER — FENTANYL 2.5 MCG/ML BUPIVACAINE 1/10 % EPIDURAL INFUSION (WH - ANES)
14.0000 mL/h | INTRAMUSCULAR | Status: DC | PRN
  Administered 2017-11-27: 14 mL/h via EPIDURAL

## 2017-11-27 MED ORDER — SOD CITRATE-CITRIC ACID 500-334 MG/5ML PO SOLN: 30.0000 mL | ORAL | Status: DC | PRN

## 2017-11-27 MED ORDER — LIDOCAINE HCL (PF) 1 % IJ SOLN
INTRAMUSCULAR | Status: DC | PRN
  Administered 2017-11-27 (×2): 4 mL via EPIDURAL

## 2017-11-27 MED ORDER — LIDOCAINE HCL (PF) 1 % IJ SOLN
30.0000 mL | INTRAMUSCULAR | Status: DC | PRN
  Filled 2017-11-27: qty 30

## 2017-11-27 MED ORDER — FENTANYL 2.5 MCG/ML BUPIVACAINE 1/10 % EPIDURAL INFUSION (WH - ANES)
INTRAMUSCULAR | Status: AC
  Filled 2017-11-27: qty 100

## 2017-11-27 MED ORDER — MEDROXYPROGESTERONE ACETATE 150 MG/ML IM SUSP: 150.0000 mg | INTRAMUSCULAR | Status: DC | PRN

## 2017-11-27 MED ORDER — IBUPROFEN 600 MG PO TABS
600.0000 mg | ORAL_TABLET | Freq: Four times a day (QID) | ORAL | Status: DC
  Administered 2017-11-27 – 2017-11-28 (×4): 600 mg via ORAL
  Filled 2017-11-27 (×4): qty 1

## 2017-11-27 MED ORDER — SENNOSIDES-DOCUSATE SODIUM 8.6-50 MG PO TABS
2.0000 | ORAL_TABLET | ORAL | Status: DC
  Administered 2017-11-28: 2 via ORAL
  Filled 2017-11-27: qty 2

## 2017-11-27 MED ORDER — LACTATED RINGERS IV SOLN
500.0000 mL | Freq: Once | INTRAVENOUS | Status: AC
  Administered 2017-11-27: 500 mL via INTRAVENOUS

## 2017-11-27 MED ORDER — OXYCODONE-ACETAMINOPHEN 5-325 MG PO TABS: 2.0000 | ORAL_TABLET | ORAL | Status: DC | PRN

## 2017-11-27 MED ORDER — DIPHENHYDRAMINE HCL 50 MG/ML IJ SOLN: 12.5000 mg | INTRAMUSCULAR | Status: DC | PRN

## 2017-11-27 MED ORDER — ONDANSETRON HCL 4 MG PO TABS: 4.0000 mg | ORAL_TABLET | ORAL | Status: DC | PRN

## 2017-11-27 MED ORDER — TERBUTALINE SULFATE 1 MG/ML IJ SOLN
0.2500 mg | Freq: Once | INTRAMUSCULAR | Status: DC | PRN
  Filled 2017-11-27: qty 1

## 2017-11-27 MED ORDER — PRENATAL MULTIVITAMIN CH
1.0000 | ORAL_TABLET | Freq: Every day | ORAL | Status: DC
  Administered 2017-11-28: 1 via ORAL
  Filled 2017-11-27: qty 1

## 2017-11-27 MED ORDER — ONDANSETRON HCL 4 MG/2ML IJ SOLN: 4.0000 mg | Freq: Four times a day (QID) | INTRAMUSCULAR | Status: DC | PRN

## 2017-11-27 MED ORDER — LACTATED RINGERS IV SOLN: 500.0000 mL | INTRAVENOUS | Status: DC | PRN

## 2017-11-27 MED ORDER — ACETAMINOPHEN 325 MG PO TABS
650.0000 mg | ORAL_TABLET | ORAL | Status: DC | PRN
  Administered 2017-11-27: 650 mg via ORAL
  Filled 2017-11-27: qty 2

## 2017-11-27 MED ORDER — BUTORPHANOL TARTRATE 1 MG/ML IJ SOLN: 1.0000 mg | INTRAMUSCULAR | Status: DC | PRN

## 2017-11-27 MED ORDER — MISOPROSTOL 25 MCG QUARTER TABLET
25.0000 ug | ORAL_TABLET | ORAL | Status: AC | PRN
  Administered 2017-11-27 (×2): 25 ug via VAGINAL
  Filled 2017-11-27 (×2): qty 1

## 2017-11-27 MED ORDER — FLUTICASONE PROPIONATE 50 MCG/ACT NA SUSP: 1.0000 | Freq: Every day | NASAL | Status: DC | PRN

## 2017-11-27 MED ORDER — SIMETHICONE 80 MG PO CHEW: 80.0000 mg | CHEWABLE_TABLET | ORAL | Status: DC | PRN

## 2017-11-27 MED ORDER — COCONUT OIL OIL
1.0000 "application " | TOPICAL_OIL | Status: DC | PRN
  Administered 2017-11-27: 1 via TOPICAL
  Filled 2017-11-27: qty 120

## 2017-11-27 MED ORDER — DIPHENHYDRAMINE HCL 25 MG PO CAPS
25.0000 mg | ORAL_CAPSULE | Freq: Four times a day (QID) | ORAL | Status: DC | PRN
Start: 2017-11-27 — End: 2017-11-28

## 2017-11-27 MED ORDER — PHENYLEPHRINE 40 MCG/ML (10ML) SYRINGE FOR IV PUSH (FOR BLOOD PRESSURE SUPPORT)
80.0000 ug | PREFILLED_SYRINGE | INTRAVENOUS | Status: DC | PRN
  Filled 2017-11-27: qty 5

## 2017-11-27 MED ORDER — LACTATED RINGERS IV SOLN: 500.0000 mL | Freq: Once | INTRAVENOUS | Status: DC

## 2017-11-27 MED ORDER — ONDANSETRON HCL 4 MG/2ML IJ SOLN
4.0000 mg | INTRAMUSCULAR | Status: DC | PRN
Start: 2017-11-27 — End: 2017-11-28

## 2017-11-27 MED ORDER — ACETAMINOPHEN 325 MG PO TABS: 650.0000 mg | ORAL_TABLET | ORAL | Status: DC | PRN

## 2017-11-27 NOTE — Anesthesia Procedure Notes (Signed)
Epidural Patient location during procedure: OB Start time: 11/27/2017 10:20 AM  Staffing Anesthesiologist: Mal AmabileFoster, Tracee Mccreery, MD Performed: anesthesiologist   Preanesthetic Checklist Completed: patient identified, site marked, surgical consent, pre-op evaluation, timeout performed, IV checked, risks and benefits discussed and monitors and equipment checked  Epidural Patient position: sitting Prep: site prepped and draped and DuraPrep Patient monitoring: continuous pulse ox and blood pressure Approach: midline Location: L3-L4 Injection technique: LOR air  Needle:  Needle type: Tuohy  Needle gauge: 17 G Needle length: 9 cm and 9 Needle insertion depth: 4 cm Catheter type: closed end flexible Catheter size: 19 Gauge Catheter at skin depth: 9 cm Test dose: negative and Other  Assessment Events: blood not aspirated, injection not painful, no injection resistance, negative IV test and no paresthesia  Additional Notes Patient identified. Risks and benefits discussed including failed block, incomplete  Pain control, post dural puncture headache, nerve damage, paralysis, blood pressure Changes, nausea, vomiting, reactions to medications-both toxic and allergic and post Partum back pain. All questions were answered. Patient expressed understanding and wished to proceed. Sterile technique was used throughout procedure. Epidural site was Dressed with sterile barrier dressing. No paresthesias, signs of intravascular injection Or signs of intrathecal spread were encountered.  Patient was more comfortable after the epidural was dosed. Please see RN's note for documentation of vital signs and FHR which are stable.

## 2017-11-27 NOTE — H&P (Signed)
Courtney Tyler is a 32 y.o. female presenting for IOL for elective reasons.  Pregnancy uncomplicated. GBS-. OB History    Gravida  5   Para  2   Term  2   Preterm      AB  2   Living  2     SAB  2   TAB      Ectopic      Multiple      Live Births  2          Past Medical History:  Diagnosis Date  . Abnormal Pap smear 2006   [place] [place]  . Anxiety    HO Panic attacks  . Depression    mild pp dep no meds  . History of recurrent UTIs    resolved around 2011  . Vaginal Pap smear, abnormal    Past Surgical History:  Procedure Laterality Date  . DILATION AND EVACUATION  01/23/2012   Procedure: DILATATION AND EVACUATION;  Surgeon: Zelphia CairoGretchen Adkins, MD;  Location: [facility];  Service: Gynecology;  Laterality: N/A;  . DILATION AND EVACUATION  03/17/2012   Procedure: DILATATION AND EVACUATION;  Surgeon: Zelphia CairoGretchen Adkins, MD;  Location: [facility];  Service: Gynecology;  Laterality: N/A;  . LEEP    . VAGINAL DELIVERY  07/16/10   Family History: family history includes Asthma in her mother; Cancer in her maternal grandfather; Migraines in her mother. Social History:  reports that she has never smoked. She has never used smokeless tobacco. She reports that she drinks alcohol. She reports that she does not use drugs.     Maternal Diabetes: No Genetic Screening: Normal Maternal Ultrasounds/Referrals: Normal Fetal Ultrasounds or other Referrals:  None Maternal Substance Abuse:  No Significant Maternal Medications:  None Significant Maternal Lab Results:  None Other Comments:  None  ROS History Dilation: 1.5 Effacement (%): 70 Station: -2 Exam by:: Carney LivingHanna Beitz RN  Blood pressure 105/67, pulse 68, temperature 98.9 F (37.2 Tyler), resp. rate 18, height 5\' 8"  (1.727 m), weight 189 lb (85.7 kg). Exam Physical Exam  Prenatal labs: ABO, Rh: --/--/O POS (03/25 0035) Antibody: NEG (03/25 0035) Rubella: Immune (08/16 0000) RPR: Nonreactive  (08/16 0000)  HBsAg: Negative (08/16 0000)  HIV: Non-reactive (08/16 0000)  GBS: Negative (03/25 0000)   Assessment/Plan: IUP at term S/P cytotec x 2 now with SROM clear fluid and early labor Anticipate SVD    Courtney Tyler 11/27/2017, 8:52 AM

## 2017-11-27 NOTE — Lactation Note (Signed)
This note was copied from a baby's chart. Lactation Consultation Note  Patient Name: Girl Stan HeadJennifer Dumond ZOXWR'UToday's Date: 11/27/2017   Dad had called for assist. Mom is a P3 who nursed her 1st child for a number of months (despite pain & pumping), and her 2nd child for only a few weeks.   This LC assisted Mom in latching infant, but Mom found it painful (despite lowering infant's chin) & requested that infant be removed. Mom had already brought some nipple shields with her, so a nipple shield was tried. Although it decreased [name] pain, there were no swallows noted with the nipple shield.   Hand expression was taught to Mom & infant was spoon-fed. Mom was able to bring infant to breast & get her latched with relative comfort.   Although infant's tongue mobility seemed good overall, there was no elevation with crying & there was a slight heart-shape to tongue with certain tongue movements. A frenulum is easily palpated near the anterior portion of the floor of the mouth.  Parents report that first child had a frenotomy.   Mom has a Spectra pump at home.  Lurline HareRichey, Treysen Sudbeck [facility] 11/27/2017, 8:19 PM

## 2017-11-27 NOTE — Lactation Note (Signed)
This note was copied from a baby's chart. Lactation Consultation Note  Patient Name: Girl Stan HeadJennifer Reisner ZOXWR'UToday's Date: 11/27/2017 Reason for consult: Initial assessment  Initial visit attempted at 3 hours of life, but Mom has multiple visitors in room. Mom has my # to call when ready for consult.  Lurline HareRichey, Derrik Mceachern [facility] 11/27/2017, 4:06 PM

## 2017-11-27 NOTE — Anesthesia Postprocedure Evaluation (Signed)
Anesthesia Post Note  Patient: Courtney Tyler  Procedure(s) Performed: AN AD HOC LABOR EPIDURAL     Patient location during evaluation: Mother Baby Anesthesia Type: Epidural Level of consciousness: awake Pain management: satisfactory to patient Vital Signs Assessment: post-procedure vital signs reviewed and stable Respiratory status: spontaneous breathing Cardiovascular status: stable Anesthetic complications: no    Last Vitals:  Vitals:   11/27/17 1415 11/27/17 1530  BP: 103/61 114/71  Pulse: 64 70  Resp: 17 18  Temp: 36.6 C 36.7 C    Last Pain:  Vitals:   11/27/17 1530  TempSrc: Oral  PainSc: 0-No pain   Pain Goal:                 KeyCorpBURGER,Klye Besecker

## 2017-11-27 NOTE — Anesthesia Preprocedure Evaluation (Signed)
Anesthesia Evaluation  ?Patient identified by MRN, date of birth, ID band ?Patient awake ? ? ? ?Reviewed: ?Allergy & Precautions, H&P , Patient's Chart, lab work & pertinent test results ? ?Airway ?Mallampati: II ? ?TM Distance: >3 FB ?Neck ROM: full ? ? ? Dental ?no notable dental hx. ?(+) Teeth Intact ?  ?Pulmonary ?neg pulmonary ROS,  ?  ?Pulmonary exam normal ?breath sounds clear to auscultation ? ? ? ? ? ? Cardiovascular ?negative cardio ROS ? ? ?Rhythm:regular Rate:Normal ? ? ?  ?Neuro/Psych ?negative neurological ROS ? negative psych ROS  ? GI/Hepatic ?negative GI ROS, Neg liver ROS,   ?Endo/Other  ?negative endocrine ROS ? Renal/GU ?negative Renal ROS  ?negative genitourinary ?  ?Musculoskeletal ? ? Abdominal ?Normal abdominal exam  (+)   ?Peds ? Hematology ?negative hematology ROS ?(+)   ?Anesthesia Other Findings ? ? Reproductive/Obstetrics ?(+) Pregnancy ? ?  ? ? ? ? ? ? ? ? ? ? ? ? ? ?  ?  ? ? ? ? ? ? ? ? ?Anesthesia Physical ? ?Anesthesia Plan ? ?ASA: II ? ?Anesthesia Plan: Epidural  ? ?Post-op Pain Management:   ? ?Induction:  ? ?PONV Risk Score and Plan:  ? ?Airway Management Planned:  ? ?Additional Equipment:  ? ?Intra-op Plan:  ? ?Post-operative Plan:  ? ?Informed Consent: I have reviewed the patients History and Physical, chart, labs and discussed the procedure including the risks, benefits and alternatives for the proposed anesthesia with the patient or authorized representative who has indicated his/her understanding and acceptance.  ? ? ? ? ? ?Plan Discussed with: Anesthesiologist ? ?Anesthesia Plan Comments:   ? ? ? ? ? ? ?Anesthesia Quick Evaluation ? ?

## 2017-11-28 LAB — CBC
HEMATOCRIT: 32.3 % — AB (ref 36.0–46.0)
Hemoglobin: 11 g/dL — ABNORMAL LOW (ref 12.0–15.0)
MCH: 31.2 pg (ref 26.0–34.0)
MCHC: 34.1 g/dL (ref 30.0–36.0)
MCV: 91.5 fL (ref 78.0–100.0)
PLATELETS: 164 10*3/uL (ref 150–400)
RBC: 3.53 MIL/uL — ABNORMAL LOW (ref 3.87–5.11)
RDW: 13 % (ref 11.5–15.5)
WBC: 10.2 10*3/uL (ref 4.0–10.5)

## 2017-11-28 MED ORDER — IBUPROFEN 600 MG PO TABS: 600.0000 mg | ORAL_TABLET | Freq: Four times a day (QID) | ORAL | 0 refills | Status: DC

## 2017-11-28 MED ORDER — OXYCODONE-ACETAMINOPHEN 5-325 MG PO TABS: 1.0000 | ORAL_TABLET | ORAL | 0 refills | Status: DC | PRN

## 2017-11-28 NOTE — Discharge Summary (Signed)
Obstetric Discharge Summary Reason for Admission: induction of labor Prenatal Procedures: none Intrapartum Procedures: spontaneous vaginal delivery Postpartum Procedures: none Complications-Operative and Postpartum: 2nd degree perineal laceration Hemoglobin  Date Value Ref Range Status  11/28/2017 11.0 (L) 12.0 - 15.0 g/dL Final   HCT  Date Value Ref Range Status  11/28/2017 32.3 (L) 36.0 - 46.0 % Final    Physical Exam:  General: alert, cooperative and appears stated age 57Lochia: appropriate Uterine Fundus: firm Incision: healing well, no significant drainage, no dehiscence DVT Evaluation: No evidence of DVT seen on physical exam.  Discharge Diagnoses: Term Pregnancy-delivered  Discharge Information: Date: 11/28/2017 Activity: pelvic rest Diet: routine Medications: Ibuprofen and Percocet Condition: improved Instructions: refer to practice specific booklet Discharge to: home   Newborn Data: Live born female  Birth Weight: 7 lb 3.9 oz (3285 g) APGAR: 9, 9  Newborn Delivery   Birth date/time:  11/27/2017 12:12:00 Delivery type:  Vaginal, Spontaneous     Home with mother.  Courtney Tyler L 11/28/2017, 8:19 AM

## 2017-11-28 NOTE — Lactation Note (Signed)
This note was copied from a baby's chart. Lactation Consultation Note  Patient Name: Courtney Tyler HeadJennifer Schwebke ONGEX'BToday's Date: 11/28/2017 Reason for consult: Follow-up assessment;Nipple pain/trauma   P3, History of nipple soreness.  Abrasions on tips of nipples. Baby has small mouth and mother has medium diameter nipples. Worked with mother on wide deep latch for improved comfort including chin tub. Suggested laid back position.   Observed feeding for 10 min and baby became sleepy. Discussed APNO for increased discomfort.  Noted moderate lingual frenulum tightness. Oldest child had frenectomy.  Provided information sheet and comfort gels.  Reviewed engorgement care and monitoring voids/stools. Provided option of pumping if nipples become too sore to allow them to heal. Mom encouraged to feed baby 8-12 times/24 hours and with feeding cues.     Maternal Data Does the patient have breastfeeding experience prior to this delivery?: Yes  Feeding Feeding Type: Breast Fed Length of feed: 10 min  LATCH Score Latch: Grasps breast easily, tongue down, lips flanged, rhythmical sucking.  Audible Swallowing: A few with stimulation  Type of Nipple: Everted at rest and after stimulation  Comfort (Breast/Nipple): Filling, red/small blisters or bruises, mild/mod discomfort  Hold (Positioning): No assistance needed to correctly position infant at breast.  LATCH Score: 8  Interventions Interventions: Breast feeding basics reviewed;Support pillows;Breast compression;Hand express;Comfort gels;Coconut oil;Expressed milk  Lactation Tools Discussed/Used     Consult Status Consult Status: Complete    Hardie PulleyBerkelhammer, Ruth Boschen 11/28/2017, 9:39 AM

## 2017-11-28 NOTE — Progress Notes (Signed)
MOB was referred for history of depression/anxiety. * Referral screened out by Clinical Social Worker because none of the following criteria appear to apply: ~ History of anxiety/depression during this pregnancy, or of post-partum depression. ~ Diagnosis of anxiety and/or depression within last 3 years; Per CSW note on 05/04/2013 "She seemed irritated that the consult was written for hx of depression which she denies.  She stated that she felt she had post partum depression with her other child.  However, she was never formally diagnosed and may have overreacted.  She also denies any hx of mental health treatment or substance abuse." OR * MOB's symptoms currently being treated with medication and/or therapy. Please contact the Clinical Social Worker if needs arise, by MOB request, or if MOB scores greater than 9/yes to question 10 on [place] Postpartum Depression Screen.  Tamaj Jurgens Boyd-Gilyard, MSW, LCSW Clinical Social Work [phone]     MOB was referred for history of depression/anxiety. * Referral screened out by Clinical Social Worker because none of the following criteria appear to apply: ~ History of anxiety/depression during this pregnancy, or of post-partum depression. ~ Diagnosis of anxiety and/or depression within last 3 years; Per CSW note on 05/04/2013 "She seemed irritated that the consult was written for hx of depression which she denies.  She stated that she felt she had post partum depression with her other child.  However, she was never formally diagnosed and may have overreacted.  She also denies any hx of mental health treatment or substance abuse." OR * MOB's symptoms currently being treated with medication and/or therapy. Please contact the Clinical Social Worker if needs arise, by [facility] request, or if MOB scores greater than 9/yes to question 10 on [place] Postpartum Depression Screen.  Blaine Hamper, MSW, LCSW Clinical Social Work [phone]

## 2021-02-05 ENCOUNTER — Ambulatory Visit: Payer: 59 | Admitting: Podiatry

## 2021-02-08 ENCOUNTER — Ambulatory Visit (INDEPENDENT_AMBULATORY_CARE_PROVIDER_SITE_OTHER): Payer: 59

## 2021-02-08 ENCOUNTER — Encounter: Payer: Self-pay | Admitting: Podiatry

## 2021-02-08 ENCOUNTER — Other Ambulatory Visit: Payer: Self-pay

## 2021-02-08 ENCOUNTER — Ambulatory Visit (INDEPENDENT_AMBULATORY_CARE_PROVIDER_SITE_OTHER): Payer: 59 | Admitting: Podiatry

## 2021-02-08 DIAGNOSIS — M79671 Pain in right foot: Secondary | ICD-10-CM

## 2021-02-08 DIAGNOSIS — M79672 Pain in left foot: Secondary | ICD-10-CM

## 2021-02-08 DIAGNOSIS — M7661 Achilles tendinitis, right leg: Secondary | ICD-10-CM

## 2021-02-08 MED ORDER — DICLOFENAC SODIUM 75 MG PO TBEC: 75.0000 mg | DELAYED_RELEASE_TABLET | Freq: Two times a day (BID) | ORAL | 2 refills | Status: AC

## 2021-02-08 NOTE — Progress Notes (Signed)
Subjective:   Patient ID: Courtney Tyler, female   DOB: 35 y.o.   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  States that she has had some swelling also associated with it and does not smoke   Review of Systems  All other systems reviewed and are negative.       Objective:  Physical Exam Vitals and nursing note reviewed.  Constitutional:      Appearance: She is well-developed.  Pulmonary:     Effort: Pulmonary effort is normal.  Musculoskeletal:        General: Normal range of motion.  Skin:    General: Skin is warm.  Neurological:     Mental Status: She is alert.     Neurovascular status intact muscle strength found to be adequate range of motion adequate with mild equinus right over left with possible blockage occurring.  Patient has quite a bit of discomfort posterior heel right at the insertion of the Achilles into the calcaneus with moderate swelling within the center portion of the tendon     Assessment:  Acute Achilles tendinitis right with equinus is complicating factor     Plan:  H&P x-ray reviewed discussed bone spur formation reduced motion and due to the discomfort and her significant activity levels I applied air fracture walker to immobilize along with ice with instructions on stretching.  Patient will be seen back may require orthotics to lift up the arch and we will make that decision based on what happens and also would want heel lift on the right side.  Dispensed heel lifts today also  X-rays indicate small posterior spur formation right heel no indication stress fracture arthritis

## 2021-02-08 NOTE — Patient Instructions (Signed)

## 2021-03-18 ENCOUNTER — Ambulatory Visit: Payer: 59 | Admitting: Podiatry

## 2022-11-09 DIAGNOSIS — Z124 Encounter for screening for malignant neoplasm of cervix: Secondary | ICD-10-CM | POA: Diagnosis not present

## 2022-11-09 DIAGNOSIS — Z131 Encounter for screening for diabetes mellitus: Secondary | ICD-10-CM | POA: Diagnosis not present

## 2022-11-09 DIAGNOSIS — Z130 Encounter for screening for diseases of the blood and blood-forming organs and certain disorders involving the immune mechanism: Secondary | ICD-10-CM | POA: Diagnosis not present

## 2022-11-09 DIAGNOSIS — Z13220 Encounter for screening for lipoid disorders: Secondary | ICD-10-CM | POA: Diagnosis not present

## 2022-11-09 DIAGNOSIS — Z13228 Encounter for screening for other metabolic disorders: Secondary | ICD-10-CM | POA: Diagnosis not present

## 2022-11-09 DIAGNOSIS — Z01419 Encounter for gynecological examination (general) (routine) without abnormal findings: Secondary | ICD-10-CM | POA: Diagnosis not present

## 2022-11-09 DIAGNOSIS — Z1151 Encounter for screening for human papillomavirus (HPV): Secondary | ICD-10-CM | POA: Diagnosis not present

## 2022-11-09 DIAGNOSIS — Z1321 Encounter for screening for nutritional disorder: Secondary | ICD-10-CM | POA: Diagnosis not present

## 2022-11-09 DIAGNOSIS — Z6824 Body mass index (BMI) 24.0-24.9, adult: Secondary | ICD-10-CM | POA: Diagnosis not present

## 2023-04-26 DIAGNOSIS — S76312A Strain of muscle, fascia and tendon of the posterior muscle group at thigh level, left thigh, initial encounter: Secondary | ICD-10-CM | POA: Diagnosis not present

## 2023-04-26 DIAGNOSIS — M25562 Pain in left knee: Secondary | ICD-10-CM | POA: Diagnosis not present

## 2023-04-28 DIAGNOSIS — S76312A Strain of muscle, fascia and tendon of the posterior muscle group at thigh level, left thigh, initial encounter: Secondary | ICD-10-CM | POA: Diagnosis not present

## 2023-04-28 DIAGNOSIS — M25562 Pain in left knee: Secondary | ICD-10-CM | POA: Diagnosis not present

## 2023-05-01 DIAGNOSIS — S76312A Strain of muscle, fascia and tendon of the posterior muscle group at thigh level, left thigh, initial encounter: Secondary | ICD-10-CM | POA: Diagnosis not present

## 2023-05-01 DIAGNOSIS — M25562 Pain in left knee: Secondary | ICD-10-CM | POA: Diagnosis not present

## 2023-08-01 DIAGNOSIS — J020 Streptococcal pharyngitis: Secondary | ICD-10-CM | POA: Diagnosis not present

## 2023-08-01 DIAGNOSIS — R519 Headache, unspecified: Secondary | ICD-10-CM | POA: Diagnosis not present

## 2023-08-01 DIAGNOSIS — R059 Cough, unspecified: Secondary | ICD-10-CM | POA: Diagnosis not present

## 2023-08-27 DIAGNOSIS — S51012A Laceration without foreign body of left elbow, initial encounter: Secondary | ICD-10-CM | POA: Diagnosis not present

## 2023-10-22 DIAGNOSIS — J111 Influenza due to unidentified influenza virus with other respiratory manifestations: Secondary | ICD-10-CM | POA: Diagnosis not present

## 2023-10-22 DIAGNOSIS — J069 Acute upper respiratory infection, unspecified: Secondary | ICD-10-CM | POA: Diagnosis not present

## 2023-10-22 DIAGNOSIS — R051 Acute cough: Secondary | ICD-10-CM | POA: Diagnosis not present

## 2023-10-24 ENCOUNTER — Other Ambulatory Visit: Payer: Self-pay | Admitting: Sports Medicine

## 2023-10-24 ENCOUNTER — Ambulatory Visit
Admission: RE | Admit: 2023-10-24 | Discharge: 2023-10-24 | Disposition: A | Discharge status: Home / Self Care | Payer: BC Managed Care – PPO | Source: Ambulatory Visit | Attending: Sports Medicine | Admitting: Sports Medicine

## 2023-10-24 DIAGNOSIS — M25561 Pain in right knee: Secondary | ICD-10-CM

## 2023-10-24 DIAGNOSIS — S86811A Strain of other muscle(s) and tendon(s) at lower leg level, right leg, initial encounter: Secondary | ICD-10-CM | POA: Diagnosis not present

## 2023-11-13 DIAGNOSIS — S83511A Sprain of anterior cruciate ligament of right knee, initial encounter: Secondary | ICD-10-CM | POA: Diagnosis not present

## 2023-11-13 DIAGNOSIS — M25561 Pain in right knee: Secondary | ICD-10-CM | POA: Diagnosis not present

## 2023-11-13 DIAGNOSIS — M25661 Stiffness of right knee, not elsewhere classified: Secondary | ICD-10-CM | POA: Diagnosis not present

## 2023-11-13 DIAGNOSIS — S86812A Strain of other muscle(s) and tendon(s) at lower leg level, left leg, initial encounter: Secondary | ICD-10-CM | POA: Diagnosis not present

## 2023-11-14 DIAGNOSIS — Z6826 Body mass index (BMI) 26.0-26.9, adult: Secondary | ICD-10-CM | POA: Diagnosis not present

## 2023-11-14 DIAGNOSIS — E663 Overweight: Secondary | ICD-10-CM | POA: Diagnosis not present

## 2023-11-14 DIAGNOSIS — R5383 Other fatigue: Secondary | ICD-10-CM | POA: Diagnosis not present

## 2023-11-14 DIAGNOSIS — Z01419 Encounter for gynecological examination (general) (routine) without abnormal findings: Secondary | ICD-10-CM | POA: Diagnosis not present

## 2023-11-14 DIAGNOSIS — L659 Nonscarring hair loss, unspecified: Secondary | ICD-10-CM | POA: Diagnosis not present

## 2023-11-14 DIAGNOSIS — Z13220 Encounter for screening for lipoid disorders: Secondary | ICD-10-CM | POA: Diagnosis not present

## 2024-01-03 DIAGNOSIS — M25561 Pain in right knee: Secondary | ICD-10-CM | POA: Diagnosis not present

## 2024-01-03 DIAGNOSIS — M223X1 Other derangements of patella, right knee: Secondary | ICD-10-CM | POA: Diagnosis not present

## 2024-01-09 DIAGNOSIS — M25561 Pain in right knee: Secondary | ICD-10-CM | POA: Diagnosis not present

## 2024-01-09 DIAGNOSIS — M223X1 Other derangements of patella, right knee: Secondary | ICD-10-CM | POA: Diagnosis not present

## 2024-01-16 DIAGNOSIS — M25561 Pain in right knee: Secondary | ICD-10-CM | POA: Diagnosis not present

## 2024-01-16 DIAGNOSIS — M223X1 Other derangements of patella, right knee: Secondary | ICD-10-CM | POA: Diagnosis not present

## 2024-01-18 DIAGNOSIS — M25561 Pain in right knee: Secondary | ICD-10-CM | POA: Diagnosis not present

## 2024-01-18 DIAGNOSIS — M223X1 Other derangements of patella, right knee: Secondary | ICD-10-CM | POA: Diagnosis not present

## 2024-01-19 DIAGNOSIS — Z01812 Encounter for preprocedural laboratory examination: Secondary | ICD-10-CM | POA: Diagnosis not present

## 2024-01-22 DIAGNOSIS — M25561 Pain in right knee: Secondary | ICD-10-CM | POA: Diagnosis not present

## 2024-01-22 DIAGNOSIS — M223X1 Other derangements of patella, right knee: Secondary | ICD-10-CM | POA: Diagnosis not present

## 2024-03-18 DIAGNOSIS — M223X1 Other derangements of patella, right knee: Secondary | ICD-10-CM | POA: Diagnosis not present

## 2024-03-18 DIAGNOSIS — M25561 Pain in right knee: Secondary | ICD-10-CM | POA: Diagnosis not present

## 2024-06-03 DIAGNOSIS — F418 Other specified anxiety disorders: Secondary | ICD-10-CM | POA: Diagnosis not present

## 2024-07-22 DIAGNOSIS — M223X1 Other derangements of patella, right knee: Secondary | ICD-10-CM | POA: Diagnosis not present

## 2024-07-22 DIAGNOSIS — M25561 Pain in right knee: Secondary | ICD-10-CM | POA: Diagnosis not present
